# Patient Record
Sex: Female | Born: 1955 | Race: White | Hispanic: No | Smoking: Current every day smoker
Health system: Southern US, Community
[De-identification: ages and names within clinical notes are randomized; demographics above are authoritative.]

## PROBLEM LIST (undated history)

## (undated) DIAGNOSIS — Z86010 Personal history of colonic polyps: Secondary | ICD-10-CM

## (undated) DIAGNOSIS — F329 Major depressive disorder, single episode, unspecified: Secondary | ICD-10-CM

## (undated) DIAGNOSIS — K589 Irritable bowel syndrome without diarrhea: Secondary | ICD-10-CM

## (undated) DIAGNOSIS — F32A Depression, unspecified: Secondary | ICD-10-CM

## (undated) DIAGNOSIS — I10 Essential (primary) hypertension: Secondary | ICD-10-CM

## (undated) DIAGNOSIS — Z800 Family history of malignant neoplasm of digestive organs: Secondary | ICD-10-CM

## (undated) DIAGNOSIS — K625 Hemorrhage of anus and rectum: Secondary | ICD-10-CM

## (undated) DIAGNOSIS — K5730 Diverticulosis of large intestine without perforation or abscess without bleeding: Secondary | ICD-10-CM

## (undated) DIAGNOSIS — K648 Other hemorrhoids: Secondary | ICD-10-CM

## (undated) DIAGNOSIS — I7300 Raynaud's syndrome without gangrene: Secondary | ICD-10-CM

## (undated) DIAGNOSIS — K5909 Other constipation: Secondary | ICD-10-CM

## (undated) HISTORY — DX: Major depressive disorder, single episode, unspecified: F32.9

## (undated) HISTORY — PX: TUBAL LIGATION: SHX77

## (undated) HISTORY — DX: Irritable bowel syndrome, unspecified: K58.9

## (undated) HISTORY — DX: Family history of malignant neoplasm of digestive organs: Z80.0

## (undated) HISTORY — DX: Other hemorrhoids: K64.8

## (undated) HISTORY — DX: Other constipation: K59.09

## (undated) HISTORY — DX: Diverticulosis of large intestine without perforation or abscess without bleeding: K57.30

## (undated) HISTORY — DX: Depression, unspecified: F32.A

## (undated) HISTORY — DX: Personal history of colonic polyps: Z86.010

## (undated) HISTORY — PX: POLYPECTOMY: SHX149

## (undated) HISTORY — DX: Hemorrhage of anus and rectum: K62.5

## (undated) HISTORY — DX: Raynaud's syndrome without gangrene: I73.00

## (undated) HISTORY — PX: COLONOSCOPY: SHX174

## (undated) HISTORY — PX: FEMUR FRACTURE SURGERY: SHX633

---
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
## 1998-06-05 ENCOUNTER — Ambulatory Visit (HOSPITAL_COMMUNITY): Admission: RE | Admit: 1998-06-05 | Discharge: 1998-06-05 | Payer: Self-pay | Admitting: Family Medicine

## 1998-06-06 ENCOUNTER — Ambulatory Visit (HOSPITAL_COMMUNITY): Admission: RE | Admit: 1998-06-06 | Discharge: 1998-06-06 | Payer: Self-pay | Admitting: Family Medicine

## 2001-03-27 ENCOUNTER — Ambulatory Visit (HOSPITAL_COMMUNITY): Admission: RE | Admit: 2001-03-27 | Discharge: 2001-03-27 | Payer: Self-pay | Admitting: Obstetrics & Gynecology

## 2001-09-02 ENCOUNTER — Encounter: Payer: Self-pay | Admitting: Obstetrics & Gynecology

## 2001-09-02 ENCOUNTER — Ambulatory Visit (HOSPITAL_COMMUNITY): Admission: RE | Admit: 2001-09-02 | Discharge: 2001-09-02 | Payer: Self-pay | Admitting: Obstetrics & Gynecology

## 2002-05-17 ENCOUNTER — Other Ambulatory Visit: Admission: RE | Admit: 2002-05-17 | Discharge: 2002-05-17 | Payer: Self-pay | Admitting: Obstetrics and Gynecology

## 2002-09-06 ENCOUNTER — Encounter: Payer: Self-pay | Admitting: Obstetrics & Gynecology

## 2002-09-06 ENCOUNTER — Ambulatory Visit (HOSPITAL_COMMUNITY): Admission: RE | Admit: 2002-09-06 | Discharge: 2002-09-06 | Payer: Self-pay | Admitting: Obstetrics & Gynecology

## 2003-06-27 ENCOUNTER — Other Ambulatory Visit: Admission: RE | Admit: 2003-06-27 | Discharge: 2003-06-27 | Payer: Self-pay | Admitting: Obstetrics & Gynecology

## 2004-12-10 ENCOUNTER — Ambulatory Visit (HOSPITAL_COMMUNITY): Admission: RE | Admit: 2004-12-10 | Discharge: 2004-12-10 | Payer: Self-pay | Admitting: Obstetrics & Gynecology

## 2005-01-22 ENCOUNTER — Other Ambulatory Visit: Admission: RE | Admit: 2005-01-22 | Discharge: 2005-01-22 | Payer: Self-pay | Admitting: Obstetrics & Gynecology

## 2006-01-27 ENCOUNTER — Ambulatory Visit (HOSPITAL_COMMUNITY): Admission: RE | Admit: 2006-01-27 | Discharge: 2006-01-27 | Payer: Self-pay | Admitting: Specialist

## 2007-03-24 ENCOUNTER — Ambulatory Visit: Payer: Self-pay | Admitting: Gastroenterology

## 2007-11-12 HISTORY — PX: ABDOMINAL HYSTERECTOMY: SHX81

## 2007-12-22 ENCOUNTER — Ambulatory Visit: Payer: Self-pay | Admitting: Gastroenterology

## 2007-12-28 ENCOUNTER — Encounter: Payer: Self-pay | Admitting: Gastroenterology

## 2007-12-28 ENCOUNTER — Ambulatory Visit: Payer: Self-pay | Admitting: Gastroenterology

## 2007-12-29 DIAGNOSIS — Z860100 Personal history of colon polyps, unspecified: Secondary | ICD-10-CM

## 2007-12-29 HISTORY — DX: Personal history of colon polyps, unspecified: Z86.0100

## 2007-12-29 HISTORY — DX: Personal history of colonic polyps: Z86.010

## 2008-03-23 ENCOUNTER — Ambulatory Visit: Payer: Self-pay | Admitting: Gastroenterology

## 2008-03-23 LAB — CONVERTED CEMR LAB
ALT: 20 units/L (ref 0–35)
AST: 17 units/L (ref 0–37)
Albumin: 4.1 g/dL (ref 3.5–5.2)
Alkaline Phosphatase: 46 units/L (ref 39–117)
Anti Nuclear Antibody(ANA): NEGATIVE
BUN: 10 mg/dL (ref 6–23)
Basophils Absolute: 0.1 10*3/uL (ref 0.0–0.1)
Basophils Relative: 1.2 % — ABNORMAL HIGH (ref 0.0–1.0)
Bilirubin, Direct: 0.2 mg/dL (ref 0.0–0.3)
CO2: 27 meq/L (ref 19–32)
Calcium: 9.2 mg/dL (ref 8.4–10.5)
Chloride: 107 meq/L (ref 96–112)
Creatinine, Ser: 0.8 mg/dL (ref 0.4–1.2)
Eosinophils Absolute: 0.2 10*3/uL (ref 0.0–0.7)
Eosinophils Relative: 1.7 % (ref 0.0–5.0)
Ferritin: 77.3 ng/mL (ref 10.0–291.0)
Folate: 8.9 ng/mL
GFR calc Af Amer: 98 mL/min
GFR calc non Af Amer: 81 mL/min
Glucose, Bld: 104 mg/dL — ABNORMAL HIGH (ref 70–99)
HCT: 40.6 % (ref 36.0–46.0)
Hemoglobin: 13.8 g/dL (ref 12.0–15.0)
Iron: 72 ug/dL (ref 42–145)
Lymphocytes Relative: 28.9 % (ref 12.0–46.0)
MCHC: 33.9 g/dL (ref 30.0–36.0)
MCV: 93.2 fL (ref 78.0–100.0)
Monocytes Absolute: 0.6 10*3/uL (ref 0.1–1.0)
Monocytes Relative: 6.6 % (ref 3.0–12.0)
Neutro Abs: 5.7 10*3/uL (ref 1.4–7.7)
Neutrophils Relative %: 61.6 % (ref 43.0–77.0)
Platelets: 219 10*3/uL (ref 150–400)
Potassium: 4.6 meq/L (ref 3.5–5.1)
RBC: 4.35 M/uL (ref 3.87–5.11)
RDW: 12.4 % (ref 11.5–14.6)
Saturation Ratios: 18.2 % — ABNORMAL LOW (ref 20.0–50.0)
Sed Rate: 9 mm/hr (ref 0–22)
Sodium: 141 meq/L (ref 135–145)
Total Bilirubin: 1.2 mg/dL (ref 0.3–1.2)
Total Protein: 6.8 g/dL (ref 6.0–8.3)
Transferrin: 282 mg/dL (ref >=212.0)
Vitamin B-12: 461 pg/mL (ref 211–911)
WBC: 9.3 10*3/uL (ref 4.5–10.5)

## 2008-10-10 ENCOUNTER — Ambulatory Visit (HOSPITAL_COMMUNITY): Admission: RE | Admit: 2008-10-10 | Discharge: 2008-10-11 | Payer: Self-pay | Admitting: Obstetrics & Gynecology

## 2008-10-10 ENCOUNTER — Encounter (INDEPENDENT_AMBULATORY_CARE_PROVIDER_SITE_OTHER): Payer: Self-pay | Admitting: Obstetrics & Gynecology

## 2009-01-10 ENCOUNTER — Telehealth: Payer: Self-pay | Admitting: Gastroenterology

## 2009-01-11 DIAGNOSIS — K625 Hemorrhage of anus and rectum: Secondary | ICD-10-CM | POA: Insufficient documentation

## 2009-01-11 DIAGNOSIS — K5909 Other constipation: Secondary | ICD-10-CM | POA: Insufficient documentation

## 2009-01-11 DIAGNOSIS — Z860100 Personal history of colon polyps, unspecified: Secondary | ICD-10-CM | POA: Insufficient documentation

## 2009-01-11 DIAGNOSIS — K589 Irritable bowel syndrome without diarrhea: Secondary | ICD-10-CM | POA: Insufficient documentation

## 2009-01-11 DIAGNOSIS — I7300 Raynaud's syndrome without gangrene: Secondary | ICD-10-CM | POA: Insufficient documentation

## 2009-01-11 DIAGNOSIS — K648 Other hemorrhoids: Secondary | ICD-10-CM | POA: Insufficient documentation

## 2009-01-11 DIAGNOSIS — K219 Gastro-esophageal reflux disease without esophagitis: Secondary | ICD-10-CM | POA: Insufficient documentation

## 2009-01-12 ENCOUNTER — Ambulatory Visit: Payer: Self-pay | Admitting: Gastroenterology

## 2010-04-10 ENCOUNTER — Ambulatory Visit (HOSPITAL_COMMUNITY): Admission: RE | Admit: 2010-04-10 | Discharge: 2010-04-10 | Payer: Self-pay | Admitting: Obstetrics & Gynecology

## 2010-12-01 ENCOUNTER — Encounter: Payer: Self-pay | Admitting: Obstetrics & Gynecology

## 2011-03-26 NOTE — Assessment & Plan Note (Signed)
[facility]                         GASTROENTEROLOGY OFFICE NOTE   VINA, BYRD                    MRN:          [id number]  DATE:12/22/2007                            DOB:          [birth year]    INDICATION:  Breanna Phillips is a 55 year old white female who has recurrent  rectal bleeding with some associated rectal discomfort.  She was last  seen in May of 08.  At that time, she gave a strong family history of  colon cancer in her mother.  She did not follow through with her  colonoscopy as recommended.  She continues with occasional rectal  bleeding and mild rectal discomfort especially over the last several  days.  She denies other GI complaints except for chronic GERD, and she  is on over-the-counter Prilosec.  She has had no associated dysphagia or  hepatobiliary complaints.   In retrospect, she does have a positive family history of what sounds  like Raynaud's phenomenon.  She also continued to smoke cigarettes and  uses ethanol socially.  She denies associated skin rashes,  photosensitivity or any symptoms of vasculitis but does have arthralgias  in her elbows.  She has never been diagnosed as having cardiovascular  disease, and she follows a regular diet, her appetite is good, and she  denies any systemic complaints such as fever or chills.   Her only medications are multivitamins, calcium, and estrogen patch.  She denies any other associated medical problems.  She has had a  previous tubal ligation.  As mentioned above, her mother had colon  cancer in her 90s.   PHYSICAL EXAMINATION:  GENERAL:  She is an attractive, healthy-appearing  white female in no distress.  She appears her stated age.  VITAL SIGNS:  She weighs 128 pounds, and blood pressure is 124/68, pulse  was 84 and regular.  SKIN:  She did have very cold bluish digits without any definite  evidence of periungual telangiectasia.  I could not appreciate any joint  swelling,  edema, or other skin rashes.  She had no stigmata of chronic  liver disease.  CHEST:  Clear.  She was in a regular rhythm without murmurs, gallops or  rubs.  There was no organomegaly, abdominal masses, tenderness, and  bowel sounds were normal.  EXTREMITIES:  Otherwise were unremarkable.  MENTAL STATUS:  Clear.  RECTAL:  Inspection of the rectum showed no fissures or fistulae,  hemorrhoids or other lesions.  RECTAL EXAM:  Showed no masses or tenderness with soft stool in the  rectal vault that was a normal color and guaiac negative.   ASSESSMENT:  1. Intermittent rectal bleeding in a 55 year old female with probable      internal hemorrhoids but a strong family history of colon carcinoma      - rule out rectosigmoid polyps and/or carcinoma.  2. 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   RECOMMENDATIONS:  1. Check CBC and anemia profile.  Also would check ANA.  2. Reflux resume with daily Aciphex therapy.  3. Outpatient colonoscopy exam.  4. Anusol  HC suppositories at bedtime until a workup could be      completed.     Vania Rea. Jarold Motto, MD, Caleen Essex, FAGA  Electronically Signed    DRP/MedQ  DD: 12/22/2007  DT: 12/23/2007  Job #: [id number]

## 2011-03-26 NOTE — Assessment & Plan Note (Signed)
[facility]                         GASTROENTEROLOGY OFFICE NOTE   Breanna, Phillips                    MRN:          [id number]  DATE:03/24/2007                            DOB:          01/12/1956    Breanna Phillips is a 55 year old white female referred for evaluation of  rectal pain, which occurred approximately a month ago, and responded to  cortisone suppositories.  She has had some intermittent rectal bleeding  for years that she has attributed external hemorrhoids, and has been  told in the past that she had irritable bowel syndrome.  She currently  describes some mild constipation.  Denies abdominal pain, gas, bloating,  or any upper GI or hepatobiliary complaints.  It is of note, that her  mother developed colon carcinoma in her early 28s.   The patient follows a regular diet, and denies any specific food  intolerances.  She has had no anorexia or weight loss.   PAST MEDICAL HISTORY:  Otherwise noncontributory except for previous  tubal ligation.   FAMILY HISTORY:  Remarkable for colon cancer in her mother.  Her mother  and sister also have hypertension.   SOCIAL HISTORY:  She is divorced, and has 2 children.  She currently is  self employed, and does have a graduate degree.  She smokes a quarter  pack of cigarettes per day, and uses ethanol socially.   REVIEW OF SYSTEMS:  Noncontributory.  Last menstrual period earlier this  month.   MEDICATIONS:  1. Multivitamins daily.  2. Calcium 1500 mg a day.  3. Omega 3 vitamins daily.  4. Vitamin D daily.   SHE DENIES DRUG ALLERGIES.   EXAMINATION:  She is an attractive, healthy-appearing, white female, in  no acute distress, appearing her stated age.  She is 5 feet 2 inches, and weighs 127 pounds.  Blood pressure 130/82.  Pulse was 70 and regular.  I could not appreciate stigmata of chronic liver disease.  CHEST:  Was clear.  She appeared to be in a regular rhythm without murmurs,  gallops, or  rubs.  There is no hepatosplenomegaly, abdominal masses, or tenderness.  Bowel  sounds were normal.  Peripheral extremities were unremarkable.  Mental status was clear.  Inspection of the rectum showed no fissures or fistulae.  Rectal exam  showed no masses or tenderness with formed stool in the rectal vault  that was guaiac negative.   ASSESSMENT:  1. Probable resolved thrombosed internal hemorrhoid.  2. Constipation with predominant irritable bowel syndrome.  3. Family history of colon carcinoma.   RECOMMENDATIONS:  1. High-fiber diet with fiber supplements as needed.  2. Outpatient colonoscopy at her convenience.  3. Continue other medications per her primary care physicians.     Breanna Phillips. Jarold Motto, MD, Caleen Essex, FAGA  Electronically Signed    DRP/MedQ  DD: 03/24/2007  DT: 03/24/2007  Job #: [id number]   cc:   Prompt [facility]

## 2011-03-26 NOTE — H&P (Signed)
Breanna Phillips, Breanna Phillips             ACCOUNT NO.:  [id number]   MEDICAL RECORD NO.:  [id number]          PATIENT TYPE:  AMB   LOCATION:  SDC                           FACILITY:  [facility]   PHYSICIAN:  Freddy Finner, M.D.   DATE OF BIRTH:  Apr 01, 1956   DATE OF ADMISSION:  10/10/2008  DATE OF DISCHARGE:                              HISTORY & PHYSICAL   ADMISSION DIAGNOSIS:  1. Uterine leiomyomata.  2. Benign previous pelvic endometriosis.  3. Previous surgical sterilization.  4. Clinical symptoms of severe dysmenorrhea.  5. Pelvic pain.  6. Menometrorrhagia.   HISTORY OF PRESENT ILLNESS:  The patient is a 55 year old white divorced  female, gravida 4, para 2, who has a long history of perimenopausal and  abnormal bleeding history dating back at least until 2003.  Intermittently she was treated with hormonal replacement therapy and  episodically at some points in the past with oral contraceptives.  Over  the last 10-11 months she has not used any form of hormonal therapy and  has continued to have regular cycles, some at 16-day intervals with very  heavy bleeding and severe dysmenorrhea.  She is known by previous  surgical procedure of laparoscopy in the distant past which demonstrated  pelvic endometriosis and created some difficulty with conception.  She  did adequately conceive and carry two children to approximately 37  weeks' gestation.  The first was delivered vaginally, the second by  cesarean for double footling breech presentation.  In 2000 she had tubal  ligation with Filshie clip application.  Because of her current  constellation of GYN symptoms she has requested definitive surgical  intervention.  She is now admitted for laparoscopically assisted vaginal  hysterectomy, bilateral salpingo-oophorectomy.   PAST MEDICAL HISTORY:  In addition to above, includes personal history  of irritable bowel syndrome.   ALLERGIES:  She does not record any drug allergies.   PAST SURGICAL  HISTORY:  Include those noted in the present illness.  She  also had a tonsillectomy at an early age.   SOCIAL HISTORY:  She does use cigarettes intermittently.  She does use  alcohol socially.  She has never required a blood transfusion.   MEDICATIONS:  She is currently on no medications at the present time.   FAMILY HISTORY:  Remarkable for ovarian cancer in her mother who  apparently survived this and was diagnosed with the original diagnosis  in her late 62s.  Apparently she had colon cancer at the age of 58.   PHYSICAL EXAMINATION:  HEENT:  Grossly within normal limits.  NECK:  Thyroid gland is not palpably enlarged.  VITAL SIGNS:  Blood pressure in the office 140/80, height 5 feet 2  inches, weight 130.  CHEST:  Clear to auscultation.  HEART:  Normal sinus rhythm without murmurs, rubs, or gallops.  ABDOMEN:  Soft and nontender without appreciable organomegaly or  palpable masses.  BREASTS:  Exam considered to be normal.  She has had bilateral breast  augmentation.  No palpable masses, no skin change, no nipple retraction  or discharge is noted.  No axillary adenopathy.  ABDOMEN:  Soft  and nontender without appreciable organomegaly or  palpable masses.  PELVIC:  External genitalia, vagina, and cervix are normal to  inspection.  Bimanual reveals uterus to be upper normal in size.  There  is fullness noted in the right adnexa (on ultrasound this was confirmed  and revealed follicular type or hemorrhagic type cyst.  The left adnexa  is clear.  RECTAL:  The rectum is palpably normal.  Rectovaginal exam confirms the  above findings.  EXTREMITIES:  Without cyanosis, clubbing, or edema.   ASSESSMENT:  1. Uterine leiomyomata.  2. Simple-appearing right ovarian cyst.  3. Cystic areas within the myometrium consistent with adenomyosis.  4. Known history of pelvic endometriosis with previous treatment.  5. Family history of ovarian cancer.   PLAN:  Laparoscopically-assisted vaginal  hysterectomy, bilateral  salpingo-oophorectomy.  The potential risks of the procedure have been  reviewed with her including a brochure on the procedure by the ACOG.  This includes risk of injury to other organs, particularly to bladder  and bowel and vessels, risk of infection, risk of intraoperative or  postoperative hemorrhage.  Prophylactic measures have been discussed  with her including IV antibiotic and serial compression devices on lower  extremities during surgery.  The patient is now admitted and prepared to  proceed with surgery.      Freddy Finner, M.D.  Electronically Signed     WRN/MEDQ  D:  10/09/2008  T:  10/09/2008  Job:  [id number]

## 2011-03-26 NOTE — Assessment & Plan Note (Signed)
[facility]                                 ON-CALL NOTE   Breanna, Phillips                      MRN:          [id number]  DATE:12/19/2007                            DOB:          01-17-1956    Breanna Phillips is a 55 year old woman followed by Dr. Sheryn Bison.  I  am not sure what her diagnosis is.  I think it is irritable bowel  syndrome.  She has had a little bit of a pressure-like sensation in her  abdomen this week.  She has had some small amounts of bright red blood  on the toilet paper earlier in the week.  Today she had a normal brown  bowel movement was associated with some bright red blood into the  commode.  She feels okay at this time.  There is been no further  bleeding.  I recommended that we can try to get her seen in the office  and she can call for follow-up on Monday.  If this recurs,  she is  either to call me back or go to the emergency room or Urgent Care for  further evaluation, particularly if she develops severe pain or large  amounts of passing blood per rectum.     Iva Boop, MD,FACG  Electronically Signed    CEG/MedQ  DD: 12/19/2007  DT: 12/21/2007  Job #: [id number]   cc:   Vania Rea. Jarold Motto, MD, Caleen Essex, FAGA

## 2011-03-26 NOTE — Op Note (Signed)
Breanna Phillips, Breanna Phillips             ACCOUNT NO.:  [id number]   MEDICAL RECORD NO.:  [id number]          PATIENT TYPE:  OIB   LOCATION:  9304                          FACILITY:  [facility]   PHYSICIAN:  Freddy Finner, M.D.   DATE OF BIRTH:  1956/06/12   DATE OF PROCEDURE:  10/10/2008  DATE OF DISCHARGE:                               OPERATIVE REPORT   PRIORITY OPERATIVE REPORT   PREOPERATIVE DIAGNOSES:  1. Uterine leiomyomata.  2. Suspected uterine adenomyosis.  3. Known previous pelvic endometriosis.  4. Clinical symptoms of severe menorrhagia, dysmenorrhea, and pelvic      pain.   POSTOPERATIVE DIAGNOSES:  1. Uterine leiomyomata.  2. Suspected uterine adenomyosis.  3. Known previous pelvic endometriosis.  4. Clinical symptoms of severe menorrhagia, dysmenorrhea, and pelvic      pain.  5. No more evidence of recurrent endometriosis and pelvic adhesions.   OPERATIVE PROCEDURES:  1. Laparoscopically-assisted vaginal hysterectomy.  2. Lysis of pelvic adhesions.  3. Bilateral salpingo-oophorectomy.  4. Fulguration of endometriotic lesions of right pelvic sidewall.   SURGEON:  Freddy Finner, MD.   ASSISTANT:  Dineen Kid. Rana Snare, MD.   ANESTHESIA:  General endotracheal.   ESTIMATED INTRAOPERATIVE BLOOD LOSS:  300 mL.   INTRAOPERATIVE COMPLICATIONS:  None.   Details of the present illness recorded in the admission note.  The  patient was admitted on the morning of surgery.  She was given a gram of  Ancef.  She was placed in serial compression devices on her lower  extremities.  She was brought to the operating room and placed under  adequate general endotracheal anesthesia, placed in the dorsal lithotomy  position using the [place] stirrups system.  Betadine prep of abdomen,  perineum, and vagina was carried out with Betadine scrub followed by  Betadine solution.  The bladder was evacuated with a sterile technique  and a Hulka tenaculum was attached to the cervix under direct  visualization.  Sterile drapes were applied.  Two small incisions were  made, 1 through an old scar at the umbilicus and 1 just above the  symphysis through an old lower abdominal transverse scar.  Through the  upper incision, an 11 mm bladed disposable trocar was introduced while  elevating the anterior abdominal wall manually.  Direct inspection  revealed adequate placement within the peritoneal cavity with no  evidence of injury on entry.  Pneumoperitoneum was allowed to accumulate  with carbon dioxide gas.  A second trocar was placed with direct  visualization through the small incision above the symphysis.  A 5-mm  blade and trocar was used at this location and through it spring-loading  grasping forceps and later the Nezhat irrigation probe was used.  Systematic examination of pelvic and abdominal contents was carried out  as noted and findings were as noted in the postoperative diagnosis.  There was 1 small filmy adhesion in the midportion of the appendix,  which was otherwise completely normal, and it was left in place.  The  liver appeared to be normal.  There was no apparent abnormality in the  upper abdomen.  Using the Enseal  device and while placing traction on  the uterus with the Hulka and the adnexa on the right with the grasping  forceps, progressive pedicles were developed, sealed, and divided down  to a level just above the uterine artery.  The adhesions on the left  side were broken up with traction without further sharp dissection.  This adnexa was treated similarly with the dissection carried down to  the level just above the uterine arteries.  Two small endometriotic  lesions on the right pelvic sidewall were fulgurated with the Enseal  device.  Attention was turned vaginally.  A posterior weighted vaginal  retractor was placed, the Hulka tenaculum was removed, Deaver retractors  were used anteriorly and laterally for exposure.  The cervix was grasped  with a Christella Hartigan  tenaculum.  Colpotomy incision was made while tenting the  mucosa posterior to the cervix.  The cervix was circumscribed with a  scalpel to release the mucosa.  A LigaSure was then used to seal and  divide the uterosacral pedicles on each side and the bladder pillars on  each side.  The bladder was carefully advanced off the cervix.  The  cardinal ligament pedicles were sealed and divided using a LigaSure.  The anterior peritoneum was entered.  Vessel pedicles were taken with a  LigaSure, sealed, and divided.  An additional pedicle on each side above  the vessels was taken, which completely released the uterus.  The uterus  and tubes and ovaries were delivered through the vaginal introitus.  Moist tapes were used to pack the intestinal contents out of the pelvis.  The angles of the vaginal were anchored with mattress sutures of 0  Monocryl to the uterosacral on each side.  The uterosacrals were  plicated and posterior peritoneum closed with an interrupted 0 Monocryl  suture.  The cuff was closed vertically with a figure-of-eight 0  Monocryl.  A Foley catheter was placed.  Reinspection was then carried  out laparoscopically and using the Nezhat system with lactated Ringers  and vacuum aspiration, the pelvic pedicles were carefully washed and  evaluated.  A number of small oozing sites were controlled with the  Enseal.  After complete hemostasis was achieved, it was confirmed by  inspection under reduced intraabdominal pressure and under irrigation.  At this point, the procedure was complete.  The irrigating solution was  aspirated from the abdomen, the instruments removed.  The skin incisions  were closed with interrupted subcuticular sutures of 3-0 Dexon, Steri-  Strips were applied to the lower incision, a small compression bandage  was applied to the umbilicus.  The patient was awakened and taken to  recovery in good condition.      Freddy Finner, M.D.  Electronically  Signed     WRN/MEDQ  D:  10/10/2008  T:  10/10/2008  Job:  [id number]

## 2011-03-29 NOTE — Discharge Summary (Signed)
Breanna Phillips, BAUMBACH             ACCOUNT NO.:  [id number]   MEDICAL RECORD NO.:  [id number]          PATIENT TYPE:  OIB   LOCATION:  9304                          FACILITY:  [facility]   PHYSICIAN:  Freddy Finner, M.D.   DATE OF BIRTH:  1956/04/25   DATE OF ADMISSION:  10/10/2008  DATE OF DISCHARGE:  10/11/2008                               DISCHARGE SUMMARY   DISCHARGE DIAGNOSES:  1. Uterine leiomyomata.  2. Uterine adenomyosis.   Clinical symptoms of severe dysmenorrhea and pelvic pain.  She did have  pelvic adhesions, but no residual endometriosis.   OPERATIVE PROCEDURE:  Laparoscopic-assisted vaginal hysterectomy, lysis  of pelvic adhesions, bilateral salpingo-oophorectomy and progression of  1 lesion, which was perhaps suspicious for endometriosis on the right  pelvic sidewall.  Intraoperative and postoperative complications were  none.   LABORATORY DATA DURING THIS ADMISSION:  Preoperative CBC that was  normal.  Postoperative hemoglobin was 10.9, prothrombin time and PTT on  admission were normal.   History of present illness, past history, family history, review of  systems and physical exam are recorded in the admission note.  Her  physical exam was primarily remarkable for her pelvic findings with  uterine enlargement and ultrasound findings consistent with leiomyomata  and adenomyosis.   HOSPITAL COURSE:  Following admission, the patient was given 1 g of  Ancef preoperatively.  She was placed on PAS hose for her lower  extremity.  She was taken to the operating room and the above described  procedure was accomplished.  There were no intraoperative complications.  Postoperatively, the patient made excellent progress and on the first  postoperative day, she was ambulating without difficulty having adequate  bowel and bladder function, tolerating a regular diet.  She was  discharged home and progressively increasing physical activity, but no  heavy lifting or vaginal  entry.  She is to report fever or bleeding.  She was given Percocet to be taken for postoperative pain.  She is to  return to the office in approximately 2 weeks for postop followup.      Freddy Finner, M.D.  Electronically Signed     WRN/MEDQ  D:  12/17/2008  T:  12/18/2008  Job:  [id number]

## 2011-03-29 NOTE — Op Note (Signed)
[facility]  Patient:    Breanna Phillips, Breanna Phillips                      MRN: [id number] Proc. Date: 03/27/01 Attending:  Freddy Finner, M.D.                           Operative Report  PREOPERATIVE DIAGNOSIS:       Multiparity, request for surgical sterilization.  POSTOPERATIVE DIAGNOSIS:      Multiparity, request for surgical sterilization, with boggy enlargement of the uterus to approximately eight weeks gestational size.  OPERATION:                    Laparoscopy with application of Filshie clips to the isthmic portion of each fallopian tube.  INTRAOPERATIVE COMPLICATIONS: None.  INTRAOPERATIVE BLOOD LOSS:    5 cc.  ANESTHESIA:                   General endotracheal.  PROCEDURE:                    The patient is a 55 year old who has requested definitive surgical sterilization and is admitted at this time for that purpose.  She was admitted on the morning of surgery.  She was brought to the operating room, placed under adequate general endotracheal anesthesia, placed in the dorsolithotomy position using the [place] stirrup system.  Betadine prep of the abdomen, perineum, and vagina was carried out.  Hulka tenaculum was attached to the cervix under direct visualization.  The bladder was evacuated with the [facility] catheter, sterile drapes were applied.  A small incision was made at the umbilicus through an old scar and, while elevating the anterior abdominal wall, an 11 mm disposable trocar was introduced.  Inspection with the laparoscope revealed adequate placement with no evidence of injury on entry.  Pneumoperitoneum was allowed to accumulate with carbon dioxide gas. Scanning inspection of the upper abdomen revealed no apparent abnormalities. The appendix could not be visualized.  The pelvic structures were normal except for mild enlargement of the uterus.  The Filshie clip applying device was evacuated through the operating channel of the  laparoscopy and a Filshie clip applied to the isthmic portion of each fallopian tube without difficulty and without bleeding.  On completing the procedure, gas was allowed to escape from the abdomen.  The instruments were removed.  The incision at the umbilicus was closed with interrupted subcuticular sutures of 3-0 Dexon and 1/4-inch Steri-Strips.  Local block with 0.25% Marcaine was injected at the incision site for postoperative analgesia.  The patient was awakened and taken to the recovery room in good condition.  She will be discharged with routine outpatient surgical instructions for follow-up in the office in two weeks. DD:  03/27/01 TD:  03/27/01 Job: [id number]

## 2011-04-02 ENCOUNTER — Other Ambulatory Visit (HOSPITAL_COMMUNITY): Payer: Self-pay | Admitting: Obstetrics & Gynecology

## 2011-04-02 DIAGNOSIS — Z1231 Encounter for screening mammogram for malignant neoplasm of breast: Secondary | ICD-10-CM

## 2011-04-23 ENCOUNTER — Ambulatory Visit (HOSPITAL_COMMUNITY)
Admission: RE | Admit: 2011-04-23 | Discharge: 2011-04-23 | Disposition: A | Discharge status: Home / Self Care | Payer: BC Managed Care – PPO | Source: Ambulatory Visit | Attending: Obstetrics & Gynecology | Admitting: Obstetrics & Gynecology

## 2011-04-23 DIAGNOSIS — Z1231 Encounter for screening mammogram for malignant neoplasm of breast: Secondary | ICD-10-CM | POA: Insufficient documentation

## 2011-05-09 ENCOUNTER — Ambulatory Visit: Payer: Self-pay | Admitting: Gastroenterology

## 2011-08-13 LAB — CBC
MCHC: 34.3 g/dL (ref 30.0–36.0)
MCV: 94.2 fL (ref 78.0–100.0)
Platelets: 203 10*3/uL (ref 150–400)
RBC: 4.46 MIL/uL (ref 3.87–5.11)
RDW: 12.5 % (ref 11.5–15.5)

## 2011-08-13 LAB — PROTIME-INR: Prothrombin Time: 12.8 seconds (ref 11.6–15.2)

## 2011-08-13 LAB — APTT: aPTT: 26 seconds (ref 24–37)

## 2011-08-15 LAB — CBC
HCT: 31.5 % — ABNORMAL LOW (ref 36.0–46.0)
Platelets: 164 10*3/uL (ref 150–400)
WBC: 15.4 10*3/uL — ABNORMAL HIGH (ref 4.0–10.5)

## 2011-10-25 ENCOUNTER — Telehealth: Payer: Self-pay | Admitting: Gastroenterology

## 2011-10-25 NOTE — Telephone Encounter (Signed)
Pt with hx of family hx of Colon Cancer, Chronic Constipation, IBS, GERD, Rectal Bleeding, Internal Hemorrhoids, Hyperplastic Colon Polyps. Last OV 01/12/2009 for rectal bleeding; last COLON 12/28/2007. Pt reports she has seen her OB/GYN for pain under her right rib cage that has gotten worse. GYN thinks she may have gall bladder problems.  Pt given an appt with Dr Jarold Motto on 10/31/11 at 10:30am.

## 2011-10-31 ENCOUNTER — Encounter: Payer: Self-pay | Admitting: Gastroenterology

## 2011-10-31 ENCOUNTER — Ambulatory Visit: Payer: BC Managed Care – PPO | Admitting: Gastroenterology

## 2011-10-31 ENCOUNTER — Other Ambulatory Visit: Payer: Self-pay | Admitting: Gastroenterology

## 2011-10-31 ENCOUNTER — Other Ambulatory Visit (INDEPENDENT_AMBULATORY_CARE_PROVIDER_SITE_OTHER): Payer: BC Managed Care – PPO

## 2011-10-31 DIAGNOSIS — K5730 Diverticulosis of large intestine without perforation or abscess without bleeding: Secondary | ICD-10-CM

## 2011-10-31 DIAGNOSIS — R109 Unspecified abdominal pain: Secondary | ICD-10-CM | POA: Insufficient documentation

## 2011-10-31 DIAGNOSIS — K219 Gastro-esophageal reflux disease without esophagitis: Secondary | ICD-10-CM

## 2011-10-31 DIAGNOSIS — E739 Lactose intolerance, unspecified: Secondary | ICD-10-CM | POA: Insufficient documentation

## 2011-10-31 DIAGNOSIS — Z86010 Personal history of colonic polyps: Secondary | ICD-10-CM

## 2011-10-31 DIAGNOSIS — Z800 Family history of malignant neoplasm of digestive organs: Secondary | ICD-10-CM | POA: Insufficient documentation

## 2011-10-31 DIAGNOSIS — I7300 Raynaud's syndrome without gangrene: Secondary | ICD-10-CM

## 2011-10-31 LAB — CBC WITH DIFFERENTIAL/PLATELET
Basophils Absolute: 0 10*3/uL (ref 0.0–0.1)
Basophils Relative: 0.4 % (ref 0.0–3.0)
Eosinophils Relative: 1.5 % (ref 0.0–5.0)
HCT: 39.2 % (ref 36.0–46.0)
Hemoglobin: 13.3 g/dL (ref 12.0–15.0)
Lymphocytes Relative: 31.3 % (ref 12.0–46.0)
Lymphs Abs: 2.8 10*3/uL (ref 0.7–4.0)
Monocytes Relative: 5.3 % (ref 3.0–12.0)
Neutro Abs: 5.5 10*3/uL (ref 1.4–7.7)
RBC: 4.18 Mil/uL (ref 3.87–5.11)
RDW: 13.4 % (ref 11.5–14.6)
WBC: 9 10*3/uL (ref 4.5–10.5)

## 2011-10-31 LAB — BASIC METABOLIC PANEL
BUN: 13 mg/dL (ref 6–23)
CO2: 28 mEq/L (ref 19–32)
Calcium: 9.5 mg/dL (ref 8.4–10.5)
Chloride: 106 mEq/L (ref 96–112)
Creatinine, Ser: 0.7 mg/dL (ref 0.4–1.2)
GFR: 87.12 mL/min (ref >60.00)
Glucose, Bld: 92 mg/dL (ref 70–99)
Sodium: 142 mEq/L (ref 135–145)

## 2011-10-31 LAB — SEDIMENTATION RATE: Sed Rate: 15 mm/hr (ref 0–22)

## 2011-10-31 LAB — HEPATIC FUNCTION PANEL
Albumin: 4.6 g/dL (ref 3.5–5.2)
Alkaline Phosphatase: 60 U/L (ref 39–117)
Total Protein: 7.7 g/dL (ref 6.0–8.3)

## 2011-10-31 LAB — TSH: TSH: 2.22 u[IU]/mL (ref 0.35–5.50)

## 2011-10-31 LAB — IBC PANEL
Iron: 81 ug/dL (ref 42–145)
Saturation Ratios: 20.4 % (ref 20.0–50.0)

## 2011-10-31 LAB — VITAMIN B12: Vitamin B-12: 555 pg/mL (ref 211–911)

## 2011-10-31 MED ORDER — DEXLANSOPRAZOLE 60 MG PO CPDR: 60.0000 mg | DELAYED_RELEASE_CAPSULE | Freq: Every day | ORAL | Status: AC

## 2011-10-31 MED ORDER — SUCRALFATE 1 GM/10ML PO SUSP: 1.0000 g | Freq: Four times a day (QID) | ORAL | Status: DC

## 2011-10-31 NOTE — Patient Instructions (Signed)
Stop your Prilosec and start Dexilant take one a day.  Take carafate as needed.  Please go to the basement today for your labs.  Your abdominal ultrasound is scheduled for 11/06/2011 arrive at 8:45am for a 9am appt at [facility]. Have nothing to eat or drink after midnight.  Call back to schedule your colonoscopy and endoscopy and pre visit in a few weeks when Feb 2013 schedule is out.   Diet for GERD or PUD Nutrition therapy can help ease the discomfort of gastroesophageal reflux disease (GERD) and peptic ulcer disease (PUD).  HOME CARE INSTRUCTIONS   Eat your meals slowly, in a relaxed setting.   Eat 5 to 6 small meals per day.   If a food causes distress, stop eating it for a period of time.  FOODS TO AVOID  Coffee, regular or decaffeinated.   Cola beverages, regular or low calorie.   Tea, regular or decaffeinated.   Pepper.   Cocoa.   High fat foods, including meats.   Butter, margarine, hydrogenated oil (trans fats).   Peppermint or spearmint (if you have GERD).   Fruits and vegetables if not tolerated.   Alcohol.   Nicotine (smoking or chewing). This is one of the most potent stimulants to acid production in the gastrointestinal tract.   Any food that seems to aggravate your condition.  If you have questions regarding your diet, ask your caregiver or a registered dietitian. TIPS  Lying flat may make symptoms worse. Keep the head of your bed raised 6 to 9 inches (15 to 23 cm) by using a foam wedge or blocks under the legs of the bed.   Do not lay down until 3 hours after eating a meal.   Daily physical activity may help reduce symptoms.  MAKE SURE YOU:   Understand these instructions.   Will watch your condition.   Will get help right away if you are not doing well or get worse.  Document Released: 10/28/2005 Document Revised: 07/10/2011 Document Reviewed: 03/13/2009 [facility] Patient Information 2012 [place], [place].

## 2011-10-31 NOTE — Progress Notes (Signed)
History of Present Illness:  This is a very pleasant 55 year old Caucasian female with a family history of colon cancer, and previous colonoscopy 5 years ago with removal of adenomatous polyps. She also has Raynaud's phenomenon with an associated esophageal motility disturbance and chronic GERD. She is on Prilosec 20 mg a day with only mild control of her acid reflux and burning substernal pain. She has regular bowel movements without melena or hematochezia. Her main problem today is several months of right upper quadrant pain radiating into her back without root precipitating or alleviating elements. She's had nausea but no emesis, but denies clay-colored stools, dark urine, icterus, or any known history of gallbladder disease, pancreatitis or hepatitis. Both her mother and her older sister have had cholecystectomies. She's had no fever, chills, or other systemic complaints. Her appetite is good and her weight is stable. She does appear to have some lactose intolerance with associated gas and bloating.  I have reviewed this patient's present history, medical and surgical past history, allergies and medications.     ROS: The remainder of the 10 point ROS is negative     Physical Exam: General well developed well nourished patient in no acute distress, appearing her stated age Eyes PERRLA, no icterus, fundoscopic exam per opthamologist Skin no lesions noted Neck supple, no adenopathy, no thyroid enlargement, no tenderness Chest clear to percussion and auscultation Heart no significant murmurs, gallops or rubs noted Abdomen no hepatosplenomegaly masses or tenderness, BS normal. . Extremities no acute joint lesions, edema, phlebitis or evidence of cellulitis. Neurologic patient oriented x 3, cranial nerves intact, no focal neurologic deficits noted. Psychological mental status normal and normal affect.  Assessment and plan: Rule out cholelithiasis versus gas trapping in the right upper quadrant  area associated with her IBS. We will proceed with upper abdominal ultrasound exam, check pertinent labs, and I have scheduled followup endoscopy and colonoscopy per family history. Patient has not had previous endoscopic exam, and we will rule out Barrett's mucosa and the possibility of H. pylori infection. I changed her to Exelon 60 mg 30 minutes before breakfast because of her GERD and Raynaud's phenomenon. I have asked her to use Lactaid tablets with any major dairy products. He is to continue to avoid sorbitol and fructose in her diet. She has noticed worsening of her GERD with institution of [place] anti-spasmodic agent for her bladder. The patient does take when necessary Advil, aspirin, and chronic Macrodantin.  Encounter Diagnoses  Name Primary?  . Diverticulosis of colon (without mention of hemorrhage)   . Abdominal pain   . GERD (gastroesophageal reflux disease)   . Family history of malignant neoplasm of gastrointestinal tract   . Personal history of colonic polyps   . Raynaud phenomenon

## 2011-11-01 LAB — TISSUE TRANSGLUTAMINASE, IGA: Tissue Transglutaminase Ab, IgA: 3.5 U/mL (ref <20)

## 2011-11-06 ENCOUNTER — Ambulatory Visit (HOSPITAL_COMMUNITY)
Admission: RE | Admit: 2011-11-06 | Discharge: 2011-11-06 | Disposition: A | Discharge status: Home / Self Care | Payer: BC Managed Care – PPO | Source: Ambulatory Visit | Attending: Gastroenterology | Admitting: Gastroenterology

## 2011-11-06 DIAGNOSIS — R109 Unspecified abdominal pain: Secondary | ICD-10-CM | POA: Insufficient documentation

## 2011-11-06 DIAGNOSIS — K5730 Diverticulosis of large intestine without perforation or abscess without bleeding: Secondary | ICD-10-CM

## 2011-11-18 ENCOUNTER — Telehealth: Payer: Self-pay | Admitting: Gastroenterology

## 2011-11-18 NOTE — Telephone Encounter (Signed)
lmom for pt to call back

## 2011-11-19 NOTE — Telephone Encounter (Signed)
Informed pt of her lab results and U/S results. Mailed pt a copy of these tests per her request.

## 2011-12-16 ENCOUNTER — Telehealth: Payer: Self-pay | Admitting: *Deleted

## 2011-12-16 ENCOUNTER — Ambulatory Visit (AMBULATORY_SURGERY_CENTER): Payer: BC Managed Care – PPO | Admitting: *Deleted

## 2011-12-16 ENCOUNTER — Encounter: Payer: Self-pay | Admitting: Gastroenterology

## 2011-12-16 DIAGNOSIS — K5710 Diverticulosis of small intestine without perforation or abscess without bleeding: Secondary | ICD-10-CM

## 2011-12-16 DIAGNOSIS — R109 Unspecified abdominal pain: Secondary | ICD-10-CM

## 2011-12-16 DIAGNOSIS — Z800 Family history of malignant neoplasm of digestive organs: Secondary | ICD-10-CM

## 2011-12-16 DIAGNOSIS — Z86010 Personal history of colonic polyps: Secondary | ICD-10-CM

## 2011-12-16 MED ORDER — PEG-KCL-NACL-NASULF-NA ASC-C 100 G PO SOLR: ORAL | Status: DC

## 2011-12-16 NOTE — Progress Notes (Signed)
No allergy to eggs or soy products 

## 2011-12-16 NOTE — Telephone Encounter (Signed)
Breanna Phillips, This patient was here for her PV this morning and had some questions for you about her medication.  You weren't in your office so I told her that I would send you a note.  She asks that you call her back and if you try before 1:00 please call her work number [phone].    Thank you, Baxter Hire

## 2011-12-16 NOTE — Telephone Encounter (Signed)
Left message with associate for pt to call back.

## 2011-12-17 ENCOUNTER — Telehealth: Payer: Self-pay | Admitting: Gastroenterology

## 2011-12-17 MED ORDER — DEXLANSOPRAZOLE 60 MG PO CPDR: 60.0000 mg | DELAYED_RELEASE_CAPSULE | Freq: Every day | ORAL | Status: DC

## 2011-12-17 NOTE — Telephone Encounter (Signed)
See other encounter.

## 2011-12-17 NOTE — Telephone Encounter (Signed)
Pt called to ask about Dexilant. She reports it's too expensive and she can't afford it. She also reports she's tried to dole it out by taking it QOD and she seems to be doing fine. Informed her I will leave her sample at the front desk of whatever I can find. Left samples of Dexilant with a discount card; pt stated understanding.

## 2011-12-19 ENCOUNTER — Telehealth: Payer: Self-pay | Admitting: Gastroenterology

## 2011-12-19 NOTE — Telephone Encounter (Signed)
Pt spoke with Dwan Bolt today concerning the letter she received today. Morrie Sheldon wrote to inform her she may be paying for her ECL because she has a $5000 deductible plan and each procedure can cost up to $1400 or more apiece.  Pt was under the assumption this procedure is preventative and was informed d/t her diagnoses and hx of polyps, it will not be coded that way. Pt did not like or agree with GERD and Lactose intolerance. I read to her from a book about the association between Raynaud's and esophageal motility disturbance and Chronic GERD; pt did not know of that relationship. She also disagreed with Lactose Intolerance. You dictated " she does appear to have some lactose intolerance with associated gas and bloating". In her Assessment and plan, you wrote for her to use Lactaid tablets with any major dairy products. Pt denies any of this about the lactose intolerance and would like it removed. Pt insisted I inform you of this hoping you will remove it from her list of dx. Please advise. ECL with propofol for 12/30/11. Thanks.

## 2011-12-20 NOTE — Telephone Encounter (Signed)
Cancel her appt and d/c letter

## 2011-12-20 NOTE — Telephone Encounter (Signed)
lmom of cell # for pt to call back.

## 2011-12-23 ENCOUNTER — Telehealth: Payer: Self-pay | Admitting: Gastroenterology

## 2011-12-23 NOTE — Telephone Encounter (Signed)
Pt reports she has spoken to our Billing Ofc and they are sending her paperwork. When she completes the paperwork she will decide whether or not to have her ECL on 12/30/11. Informed her Dr Jarold Motto will not change any of the DX on her visit. She has been communicating back and forth with her insurance and because the procedures aren't "preventative" they will not pay. Verified with her she must call by 12/25/11 to avoid a cancellation fee; pt stated understanding.

## 2011-12-25 ENCOUNTER — Telehealth: Payer: Self-pay | Admitting: *Deleted

## 2011-12-25 ENCOUNTER — Encounter: Payer: Self-pay | Admitting: *Deleted

## 2011-12-25 MED ORDER — PANTOPRAZOLE SODIUM 40 MG PO TBEC: 40.0000 mg | DELAYED_RELEASE_TABLET | Freq: Every day | ORAL | Status: DC

## 2011-12-25 NOTE — Telephone Encounter (Signed)
pts insurance will not cover dexilant since she has not tried any other ppi except prilosec. I have called in pantoprazole for 30 days she will try it and see if it works for her.

## 2011-12-26 ENCOUNTER — Telehealth: Payer: Self-pay | Admitting: Gastroenterology

## 2011-12-26 NOTE — Telephone Encounter (Signed)
error 

## 2011-12-26 NOTE — Telephone Encounter (Signed)
Dismissal Letter sent by Certified Mail 12/26/2011  Received Return Receipt stating the patient has received the Dismissal Letter 01/01/2012

## 2011-12-27 ENCOUNTER — Telehealth: Payer: Self-pay | Admitting: Gastroenterology

## 2011-12-27 NOTE — Telephone Encounter (Signed)
12/26/11. Called patient and informed her that Dr. Jarold Motto was discharging her from the practice and therefore we were canceling her procedure for Monday.  She said "fine" and demanded a copy of her records.  I told her that I would have to transfer her to medical records and they could help her.  I accidentally hung up on her during the transfer and when she called back I apologized to her.  She was very upset (said a few choice words) and then said I want you to tell Dr. Jarold Motto that  "if he ran his practice based solely on what that little insurance girl said, then he must not be much of a doctor and I don't want to be a patient of his".   I politely thanked her and told her that I would make note of that and then transferred her to medical records.

## 2011-12-30 ENCOUNTER — Encounter: Payer: BC Managed Care – PPO | Admitting: Gastroenterology

## 2012-06-11 ENCOUNTER — Other Ambulatory Visit (HOSPITAL_COMMUNITY): Payer: Self-pay | Admitting: Obstetrics & Gynecology

## 2012-06-11 DIAGNOSIS — Z1231 Encounter for screening mammogram for malignant neoplasm of breast: Secondary | ICD-10-CM

## 2012-07-03 ENCOUNTER — Ambulatory Visit (HOSPITAL_COMMUNITY)
Admission: RE | Admit: 2012-07-03 | Discharge: 2012-07-03 | Disposition: A | Discharge status: Home / Self Care | Payer: BC Managed Care – PPO | Source: Ambulatory Visit | Attending: Obstetrics & Gynecology | Admitting: Obstetrics & Gynecology

## 2012-07-03 DIAGNOSIS — Z1231 Encounter for screening mammogram for malignant neoplasm of breast: Secondary | ICD-10-CM | POA: Insufficient documentation

## 2013-08-24 ENCOUNTER — Ambulatory Visit (INDEPENDENT_AMBULATORY_CARE_PROVIDER_SITE_OTHER): Payer: BC Managed Care – PPO | Admitting: Psychology

## 2013-08-24 ENCOUNTER — Encounter (HOSPITAL_COMMUNITY): Payer: Self-pay | Admitting: Psychology

## 2013-08-24 ENCOUNTER — Encounter (INDEPENDENT_AMBULATORY_CARE_PROVIDER_SITE_OTHER): Payer: Self-pay

## 2013-08-24 DIAGNOSIS — F411 Generalized anxiety disorder: Secondary | ICD-10-CM

## 2013-08-24 DIAGNOSIS — F331 Major depressive disorder, recurrent, moderate: Secondary | ICD-10-CM
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
## 2013-12-01 ENCOUNTER — Other Ambulatory Visit (HOSPITAL_COMMUNITY): Payer: Self-pay | Admitting: Obstetrics and Gynecology

## 2013-12-01 DIAGNOSIS — Z1231 Encounter for screening mammogram for malignant neoplasm of breast: Secondary | ICD-10-CM

## 2013-12-07 ENCOUNTER — Ambulatory Visit (HOSPITAL_COMMUNITY)
Admission: RE | Admit: 2013-12-07 | Discharge: 2013-12-07 | Disposition: A | Discharge status: Home / Self Care | Payer: BC Managed Care – PPO | Source: Ambulatory Visit | Attending: Obstetrics and Gynecology | Admitting: Obstetrics and Gynecology

## 2013-12-07 DIAGNOSIS — Z1231 Encounter for screening mammogram for malignant neoplasm of breast: Secondary | ICD-10-CM

## 2013-12-31 ENCOUNTER — Encounter (HOSPITAL_COMMUNITY): Payer: Self-pay | Admitting: Psychology

## 2013-12-31 DIAGNOSIS — F331 Major depressive disorder, recurrent, moderate: Secondary | ICD-10-CM

## 2013-12-31 DIAGNOSIS — F411 Generalized anxiety disorder: Secondary | ICD-10-CM

## 2013-12-31 NOTE — Progress Notes (Signed)
Patient ID: Breanna SenateNancy C Luten, female   DOB: 04-15-1956, 58 y.o.   MRN: [id number] Outpatient Therapist Discharge Summary  Breanna Senateancy C Phillips    04-15-1956   Admission Date: 08/24/13   Discharge Date:  12/31/13 Reason for Discharge:  Didn't return for services after admission Diagnosis:    Generalized anxiety disorder  Major depressive disorder, recurrent episode, moderate  Comments:  May return if needed in future  Forde RadonLeanne Yates

## 2014-03-01 ENCOUNTER — Encounter (HOSPITAL_COMMUNITY): Payer: Self-pay | Admitting: Emergency Medicine

## 2014-03-01 ENCOUNTER — Emergency Department (HOSPITAL_COMMUNITY): Payer: BC Managed Care – PPO | Admitting: Certified Registered Nurse Anesthetist

## 2014-03-01 ENCOUNTER — Encounter (HOSPITAL_COMMUNITY)
Admission: EM | Disposition: A | Discharge status: Home / Self Care | Payer: Self-pay | Source: Home / Self Care | Attending: Emergency Medicine

## 2014-03-01 ENCOUNTER — Encounter (HOSPITAL_COMMUNITY): Payer: BC Managed Care – PPO | Admitting: Certified Registered Nurse Anesthetist

## 2014-03-01 ENCOUNTER — Emergency Department (HOSPITAL_COMMUNITY): Payer: BC Managed Care – PPO

## 2014-03-01 ENCOUNTER — Observation Stay (HOSPITAL_COMMUNITY)
Admission: EM | Admit: 2014-03-01 | Discharge: 2014-03-01 | Disposition: A | Discharge status: Home / Self Care | Payer: BC Managed Care – PPO | Attending: Surgery | Admitting: Surgery

## 2014-03-01 DIAGNOSIS — K648 Other hemorrhoids: Secondary | ICD-10-CM | POA: Insufficient documentation

## 2014-03-01 DIAGNOSIS — K37 Unspecified appendicitis: Secondary | ICD-10-CM | POA: Diagnosis present

## 2014-03-01 DIAGNOSIS — K219 Gastro-esophageal reflux disease without esophagitis: Secondary | ICD-10-CM | POA: Insufficient documentation

## 2014-03-01 DIAGNOSIS — R1031 Right lower quadrant pain: Secondary | ICD-10-CM

## 2014-03-01 DIAGNOSIS — Z87891 Personal history of nicotine dependence: Secondary | ICD-10-CM | POA: Insufficient documentation

## 2014-03-01 DIAGNOSIS — F329 Major depressive disorder, single episode, unspecified: Secondary | ICD-10-CM | POA: Insufficient documentation

## 2014-03-01 DIAGNOSIS — I7300 Raynaud's syndrome without gangrene: Secondary | ICD-10-CM | POA: Insufficient documentation

## 2014-03-01 DIAGNOSIS — K5730 Diverticulosis of large intestine without perforation or abscess without bleeding: Secondary | ICD-10-CM | POA: Insufficient documentation

## 2014-03-01 DIAGNOSIS — I739 Peripheral vascular disease, unspecified: Secondary | ICD-10-CM | POA: Insufficient documentation

## 2014-03-01 DIAGNOSIS — Z860100 Personal history of colon polyps, unspecified: Secondary | ICD-10-CM | POA: Insufficient documentation

## 2014-03-01 DIAGNOSIS — K3580 Unspecified acute appendicitis: Secondary | ICD-10-CM

## 2014-03-01 DIAGNOSIS — F3289 Other specified depressive episodes: Secondary | ICD-10-CM | POA: Insufficient documentation

## 2014-03-01 DIAGNOSIS — K589 Irritable bowel syndrome without diarrhea: Secondary | ICD-10-CM | POA: Insufficient documentation

## 2014-03-01 HISTORY — PX: APPENDECTOMY: SHX54

## 2014-03-01 HISTORY — PX: LAPAROSCOPIC APPENDECTOMY: SHX408

## 2014-03-01 HISTORY — DX: Essential (primary) hypertension: I10

## 2014-03-01 LAB — CBC WITH DIFFERENTIAL/PLATELET
BASOS ABS: 0 10*3/uL (ref 0.0–0.1)
BASOS PCT: 0 % (ref 0–1)
EOS ABS: 0.2 10*3/uL (ref 0.0–0.7)
Eosinophils Relative: 1 % (ref 0–5)
HCT: 42.5 % (ref 36.0–46.0)
Hemoglobin: 14.2 g/dL (ref 12.0–15.0)
Lymphocytes Relative: 12 % (ref 12–46)
Lymphs Abs: 2.7 10*3/uL (ref 0.7–4.0)
MCH: 31.8 pg (ref 26.0–34.0)
MCHC: 33.4 g/dL (ref 30.0–36.0)
MCV: 95.1 fL (ref 78.0–100.0)
Monocytes Absolute: 0.9 10*3/uL (ref 0.1–1.0)
Monocytes Relative: 4 % (ref 3–12)
NEUTROS ABS: 18.9 10*3/uL — AB (ref 1.7–7.7)
NEUTROS PCT: 83 % — AB (ref 43–77)
Platelets: 216 10*3/uL (ref 150–400)
RBC: 4.47 MIL/uL (ref 3.87–5.11)
RDW: 13.6 % (ref 11.5–15.5)
WBC: 22.7 10*3/uL — ABNORMAL HIGH (ref 4.0–10.5)

## 2014-03-01 LAB — BASIC METABOLIC PANEL
BUN: 19 mg/dL (ref 6–23)
CHLORIDE: 101 meq/L (ref 96–112)
CO2: 26 mEq/L (ref 19–32)
CREATININE: 0.99 mg/dL (ref 0.50–1.10)
Calcium: 9.5 mg/dL (ref 8.4–10.5)
GFR calc non Af Amer: 63 mL/min — ABNORMAL LOW (ref >90)
GFR, EST AFRICAN AMERICAN: 73 mL/min — AB (ref >90)
Glucose, Bld: 103 mg/dL — ABNORMAL HIGH (ref 70–99)
POTASSIUM: 4.6 meq/L (ref 3.7–5.3)
Sodium: 140 mEq/L (ref 137–147)

## 2014-03-01 LAB — URINALYSIS, ROUTINE W REFLEX MICROSCOPIC
Bilirubin Urine: NEGATIVE
Glucose, UA: NEGATIVE mg/dL
Hgb urine dipstick: NEGATIVE
KETONES UR: NEGATIVE mg/dL
Leukocytes, UA: NEGATIVE
NITRITE: NEGATIVE
Protein, ur: NEGATIVE mg/dL
SPECIFIC GRAVITY, URINE: 1.017 (ref 1.005–1.030)
UROBILINOGEN UA: 0.2 mg/dL (ref 0.0–1.0)
pH: 6.5 (ref 5.0–8.0)

## 2014-03-01 SURGERY — APPENDECTOMY, LAPAROSCOPIC
Anesthesia: General | Site: Abdomen

## 2014-03-01 MED ORDER — DIPHENHYDRAMINE HCL 12.5 MG/5ML PO ELIX: 12.5000 mg | ORAL_SOLUTION | Freq: Four times a day (QID) | ORAL | Status: DC | PRN

## 2014-03-01 MED ORDER — PIPERACILLIN-TAZOBACTAM 3.375 G IVPB
3.3750 g | Freq: Once | INTRAVENOUS | Status: AC
  Administered 2014-03-01: 3.375 g via INTRAVENOUS
  Filled 2014-03-01: qty 50

## 2014-03-01 MED ORDER — ENOXAPARIN SODIUM 40 MG/0.4ML ~~LOC~~ SOLN: 40.0000 mg | SUBCUTANEOUS | Status: DC

## 2014-03-01 MED ORDER — DEXAMETHASONE SODIUM PHOSPHATE 4 MG/ML IJ SOLN
INTRAMUSCULAR | Status: DC | PRN
  Administered 2014-03-01: 4 mg via INTRAVENOUS

## 2014-03-01 MED ORDER — POTASSIUM CHLORIDE IN NACL 20-0.9 MEQ/L-% IV SOLN
INTRAVENOUS | Status: DC
  Filled 2014-03-01 (×2): qty 1000

## 2014-03-01 MED ORDER — ONDANSETRON HCL 4 MG PO TABS: 4.0000 mg | ORAL_TABLET | Freq: Four times a day (QID) | ORAL | Status: DC | PRN

## 2014-03-01 MED ORDER — KETOROLAC TROMETHAMINE 30 MG/ML IJ SOLN
INTRAMUSCULAR | Status: DC | PRN
  Administered 2014-03-01: 30 mg via INTRAVENOUS

## 2014-03-01 MED ORDER — ONDANSETRON HCL 4 MG/2ML IJ SOLN: 4.0000 mg | Freq: Once | INTRAMUSCULAR | Status: DC | PRN

## 2014-03-01 MED ORDER — ONDANSETRON HCL 4 MG/2ML IJ SOLN: 4.0000 mg | Freq: Four times a day (QID) | INTRAMUSCULAR | Status: DC | PRN

## 2014-03-01 MED ORDER — OXYCODONE HCL 5 MG PO TABS
5.0000 mg | ORAL_TABLET | ORAL | Status: DC | PRN
  Administered 2014-03-01: 10 mg via ORAL
  Filled 2014-03-01: qty 2

## 2014-03-01 MED ORDER — ROCURONIUM BROMIDE 100 MG/10ML IV SOLN
INTRAVENOUS | Status: DC | PRN
  Administered 2014-03-01: 25 mg via INTRAVENOUS

## 2014-03-01 MED ORDER — SODIUM CHLORIDE 0.9 % IR SOLN
Status: DC | PRN
  Administered 2014-03-01: 1000 mL

## 2014-03-01 MED ORDER — IOHEXOL 300 MG/ML  SOLN
80.0000 mL | Freq: Once | INTRAMUSCULAR | Status: AC | PRN
  Administered 2014-03-01: 80 mL via INTRAVENOUS

## 2014-03-01 MED ORDER — ONDANSETRON HCL 4 MG/2ML IJ SOLN
4.0000 mg | Freq: Once | INTRAMUSCULAR | Status: AC
  Administered 2014-03-01: 4 mg via INTRAVENOUS
  Filled 2014-03-01: qty 2

## 2014-03-01 MED ORDER — ACETAMINOPHEN 650 MG RE SUPP: 650.0000 mg | Freq: Four times a day (QID) | RECTAL | Status: DC | PRN

## 2014-03-01 MED ORDER — LACTATED RINGERS IV SOLN
INTRAVENOUS | Status: DC
Start: 2014-03-01 — End: 2014-03-01
  Administered 2014-03-01: 09:00:00 via INTRAVENOUS

## 2014-03-01 MED ORDER — LIDOCAINE HCL (CARDIAC) 20 MG/ML IV SOLN
INTRAVENOUS | Status: DC | PRN
  Administered 2014-03-01: 60 mg via INTRAVENOUS

## 2014-03-01 MED ORDER — FENTANYL CITRATE 0.05 MG/ML IJ SOLN
INTRAMUSCULAR | Status: DC | PRN
  Administered 2014-03-01: 150 ug via INTRAVENOUS
  Administered 2014-03-01: 25 ug via INTRAVENOUS

## 2014-03-01 MED ORDER — IOHEXOL 300 MG/ML  SOLN
20.0000 mL | INTRAMUSCULAR | Status: DC
  Administered 2014-03-01: 20 mL via ORAL

## 2014-03-01 MED ORDER — MORPHINE SULFATE 2 MG/ML IJ SOLN: 1.0000 mg | INTRAMUSCULAR | Status: DC | PRN

## 2014-03-01 MED ORDER — NEOSTIGMINE METHYLSULFATE 1 MG/ML IJ SOLN
INTRAMUSCULAR | Status: DC | PRN
  Administered 2014-03-01: 3 mg via INTRAVENOUS

## 2014-03-01 MED ORDER — OXYCODONE-ACETAMINOPHEN 5-325 MG PO TABS: 1.0000 | ORAL_TABLET | ORAL | Status: DC | PRN

## 2014-03-01 MED ORDER — DIPHENHYDRAMINE HCL 50 MG/ML IJ SOLN: 12.5000 mg | Freq: Four times a day (QID) | INTRAMUSCULAR | Status: DC | PRN

## 2014-03-01 MED ORDER — IBUPROFEN 600 MG PO TABS: 600.0000 mg | ORAL_TABLET | Freq: Four times a day (QID) | ORAL | Status: DC | PRN

## 2014-03-01 MED ORDER — ONDANSETRON HCL 4 MG/2ML IJ SOLN
INTRAMUSCULAR | Status: AC
  Filled 2014-03-01: qty 2

## 2014-03-01 MED ORDER — PROPOFOL 10 MG/ML IV BOLUS
INTRAVENOUS | Status: AC
  Filled 2014-03-01: qty 20

## 2014-03-01 MED ORDER — ROCURONIUM BROMIDE 50 MG/5ML IV SOLN
INTRAVENOUS | Status: AC
  Filled 2014-03-01: qty 1

## 2014-03-01 MED ORDER — MORPHINE SULFATE 4 MG/ML IJ SOLN: 4.0000 mg | INTRAMUSCULAR | Status: DC | PRN

## 2014-03-01 MED ORDER — HYDROMORPHONE HCL PF 1 MG/ML IJ SOLN: 0.2500 mg | INTRAMUSCULAR | Status: DC | PRN

## 2014-03-01 MED ORDER — CEFAZOLIN SODIUM-DEXTROSE 2-3 GM-% IV SOLR
INTRAVENOUS | Status: DC | PRN
  Administered 2014-03-01: 2 g via INTRAVENOUS

## 2014-03-01 MED ORDER — ONDANSETRON HCL 4 MG/2ML IJ SOLN
INTRAMUSCULAR | Status: DC | PRN
  Administered 2014-03-01: 4 mg via INTRAVENOUS

## 2014-03-01 MED ORDER — PROPOFOL 10 MG/ML IV BOLUS
INTRAVENOUS | Status: DC | PRN
  Administered 2014-03-01: 200 mg via INTRAVENOUS

## 2014-03-01 MED ORDER — GLYCOPYRROLATE 0.2 MG/ML IJ SOLN
INTRAMUSCULAR | Status: DC | PRN
  Administered 2014-03-01: 0.4 mg via INTRAVENOUS

## 2014-03-01 MED ORDER — HYDROMORPHONE HCL PF 1 MG/ML IJ SOLN
1.0000 mg | INTRAMUSCULAR | Status: DC | PRN
Start: 2014-03-01 — End: 2014-03-01

## 2014-03-01 MED ORDER — ACETAMINOPHEN 500 MG PO TABS: 1000.0000 mg | ORAL_TABLET | Freq: Four times a day (QID) | ORAL | Status: DC | PRN

## 2014-03-01 MED ORDER — BUPIVACAINE-EPINEPHRINE 0.5% -1:200000 IJ SOLN
INTRAMUSCULAR | Status: DC | PRN
  Administered 2014-03-01: 30 mL

## 2014-03-01 MED ORDER — LACTATED RINGERS IV SOLN
INTRAVENOUS | Status: DC | PRN
  Administered 2014-03-01: 13:00:00 via INTRAVENOUS

## 2014-03-01 MED ORDER — PANTOPRAZOLE SODIUM 40 MG PO TBEC: 40.0000 mg | DELAYED_RELEASE_TABLET | Freq: Every day | ORAL | Status: DC

## 2014-03-01 MED ORDER — TRAMADOL HCL 50 MG PO TABS: 50.0000 mg | ORAL_TABLET | Freq: Four times a day (QID) | ORAL | Status: AC | PRN

## 2014-03-01 MED ORDER — LIDOCAINE HCL (CARDIAC) 20 MG/ML IV SOLN
INTRAVENOUS | Status: AC
  Filled 2014-03-01: qty 5

## 2014-03-01 MED ORDER — KETOROLAC TROMETHAMINE 30 MG/ML IJ SOLN
30.0000 mg | Freq: Once | INTRAMUSCULAR | Status: AC
  Administered 2014-03-01: 30 mg via INTRAVENOUS
  Filled 2014-03-01: qty 1

## 2014-03-01 MED ORDER — FENTANYL CITRATE 0.05 MG/ML IJ SOLN
INTRAMUSCULAR | Status: AC
  Filled 2014-03-01: qty 5

## 2014-03-01 MED ORDER — BUPIVACAINE-EPINEPHRINE (PF) 0.5% -1:200000 IJ SOLN
INTRAMUSCULAR | Status: AC
  Filled 2014-03-01: qty 10

## 2014-03-01 SURGICAL SUPPLY — 48 items
APL SKNCLS STERI-STRIP NONHPOA (GAUZE/BANDAGES/DRESSINGS) ×1
APPLIER CLIP LOGIC TI 5 (MISCELLANEOUS) ×1 IMPLANT
APPLIER CLIP ROT 10 11.4 M/L (STAPLE)
APR CLP MED LRG 11.4X10 (STAPLE)
APR CLP MED LRG 33X5 (MISCELLANEOUS) ×1
BAG SPEC RTRVL LRG 6X4 10 (ENDOMECHANICALS) ×1
BANDAGE ADH SHEER 1  50/CT (GAUZE/BANDAGES/DRESSINGS) ×3 IMPLANT
BANDAGE ADHESIVE 1X3 (GAUZE/BANDAGES/DRESSINGS) ×6 IMPLANT
BENZOIN TINCTURE PRP APPL 2/3 (GAUZE/BANDAGES/DRESSINGS) ×2 IMPLANT
CANISTER SUCTION 2500CC (MISCELLANEOUS) ×2 IMPLANT
CHLORAPREP W/TINT 26ML (MISCELLANEOUS) ×2 IMPLANT
CLIP APPLIE ROT 10 11.4 M/L (STAPLE) IMPLANT
COVER SURGICAL LIGHT HANDLE (MISCELLANEOUS) ×2 IMPLANT
CUTTER LINEAR ENDO 35 ETS (STAPLE) ×1 IMPLANT
CUTTER LINEAR ENDO 35 ETS TH (STAPLE) IMPLANT
DECANTER SPIKE VIAL GLASS SM (MISCELLANEOUS) ×1 IMPLANT
DRAPE UTILITY 15X26 W/TAPE STR (DRAPE) ×4 IMPLANT
ELECT REM PT RETURN 9FT ADLT (ELECTROSURGICAL) ×2
ELECTRODE REM PT RTRN 9FT ADLT (ELECTROSURGICAL) ×1 IMPLANT
ENDOLOOP SUT PDS II  0 18 (SUTURE)
ENDOLOOP SUT PDS II 0 18 (SUTURE) IMPLANT
GLOVE BIOGEL PI IND STRL 7.0 (GLOVE) IMPLANT
GLOVE BIOGEL PI INDICATOR 7.0 (GLOVE) ×3
GLOVE SURG SIGNA 7.5 PF LTX (GLOVE) ×2 IMPLANT
GLOVE SURG SS PI 7.0 STRL IVOR (GLOVE) ×1 IMPLANT
GOWN STRL REUS W/ TWL LRG LVL3 (GOWN DISPOSABLE) ×1 IMPLANT
GOWN STRL REUS W/ TWL XL LVL3 (GOWN DISPOSABLE) ×1 IMPLANT
GOWN STRL REUS W/TWL LRG LVL3 (GOWN DISPOSABLE) ×6
GOWN STRL REUS W/TWL XL LVL3 (GOWN DISPOSABLE) ×2
KIT BASIN OR (CUSTOM PROCEDURE TRAY) ×2 IMPLANT
KIT ROOM TURNOVER OR (KITS) ×2 IMPLANT
NS IRRIG 1000ML POUR BTL (IV SOLUTION) ×2 IMPLANT
PAD ARMBOARD 7.5X6 YLW CONV (MISCELLANEOUS) ×4 IMPLANT
POUCH SPECIMEN RETRIEVAL 10MM (ENDOMECHANICALS) ×2 IMPLANT
RELOAD /EVU35 (ENDOMECHANICALS) IMPLANT
RELOAD CUTTER ETS 35MM STAND (ENDOMECHANICALS) IMPLANT
SCALPEL HARMONIC ACE (MISCELLANEOUS) ×2 IMPLANT
SET IRRIG TUBING LAPAROSCOPIC (IRRIGATION / IRRIGATOR) ×2 IMPLANT
SLEEVE ENDOPATH XCEL 5M (ENDOMECHANICALS) ×2 IMPLANT
SPECIMEN JAR SMALL (MISCELLANEOUS) ×2 IMPLANT
STRIP CLOSURE SKIN 1/2X4 (GAUZE/BANDAGES/DRESSINGS) ×1 IMPLANT
SUT MON AB 4-0 PC3 18 (SUTURE) ×2 IMPLANT
TOWEL OR 17X24 6PK STRL BLUE (TOWEL DISPOSABLE) ×2 IMPLANT
TOWEL OR 17X26 10 PK STRL BLUE (TOWEL DISPOSABLE) ×2 IMPLANT
TRAY LAPAROSCOPIC (CUSTOM PROCEDURE TRAY) ×2 IMPLANT
TROCAR XCEL BLUNT TIP 100MML (ENDOMECHANICALS) ×2 IMPLANT
TROCAR XCEL NON-BLD 5MMX100MML (ENDOMECHANICALS) ×2 IMPLANT
WATER STERILE IRR 1000ML POUR (IV SOLUTION) IMPLANT

## 2014-03-01 NOTE — Transfer of Care (Signed)
Immediate Anesthesia Transfer of Care Note  Patient: Breanna SenateNancy C Phillips  Procedure(s) Performed: Procedure(s): APPENDECTOMY LAPAROSCOPIC (N/A)  Patient Location: PACU  Anesthesia Type:General  Level of Consciousness: awake, alert  and oriented  Airway & Oxygen Therapy: Patient Spontanous Breathing and Patient connected to nasal cannula oxygen  Post-op Assessment: Report given to PACU RN, Post -op Vital signs reviewed and stable and Patient moving all extremities  Post vital signs: Reviewed and stable  Complications: No apparent anesthesia complications

## 2014-03-01 NOTE — Discharge Summary (Signed)
Physician Discharge Summary  Patient ID: Breanna Phillips MRN: [id number] DOB/AGE: 1956/09/20 58 y.o.  Admit date: 03/01/2014 Discharge date: 03/01/2014  Admission Diagnoses:  Discharge Diagnoses:  Principal Problem:   Acute appendicitis Active Problems:   Appendicitis   Discharged Condition: good  Hospital Course: admitted and taken to OR.  Did well post op and discharged the day of surgery  Consults: None  Significant Diagnostic Studies:   Treatments: surgery: laparoscopic appendectomy  Discharge Exam: Blood pressure 141/65, pulse 57, temperature 97.5 F (36.4 C), temperature source Oral, resp. rate 16, height 5\' 2"  (1.575 m), weight 130 lb (58.968 kg), SpO2 100.00%. General appearance: alert, cooperative and no distress Resp: clear to auscultation bilaterally Cardio: regular rate and rhythm, S1, S2 normal, no murmur, click, rub or gallop Incision/Wound: abdomen soft, incisions dry  Disposition:      Medication List         calcium citrate-vitamin D 315-200 MG-UNIT per tablet  Commonly known as:  CITRACAL+D  Take 1 tablet by mouth daily.     cholecalciferol 1000 UNITS tablet  Commonly known as:  VITAMIN D  Take 2,000 Units by mouth daily.     estradiol 0.05 MG/24HR patch  Commonly known as:  VIVELLE-DOT  Place 1 patch onto the skin 2 (two) times a week.     Fish Oil 1000 MG Caps  Take 1 capsule by mouth daily.     loratadine 10 MG tablet  Commonly known as:  CLARITIN  Take 10 mg by mouth daily.     MAGNESIUM-OXIDE 400 (241.3 MG) MG tablet  Generic drug:  magnesium oxide  Take 400 mg by mouth daily.     traMADol 50 MG tablet  Commonly known as:  ULTRAM  Take 1-2 tablets (50-100 mg total) by mouth every 6 (six) hours as needed.     zolpidem 10 MG tablet  Commonly known as:  AMBIEN  Take 5 mg by mouth at bedtime as needed for sleep.           Follow-up Information   [facility] Sterling A, [facility]. Call in 2 weeks. (call office tomorrow  to set up follow up appt)    Specialty:  General Surgery   Contact information:   [address] [place] [id number] [phone]       Signed: Shelly RubensteinDouglas A Tiffinie Caillier 03/01/2014, 4:25 PM

## 2014-03-01 NOTE — Anesthesia Preprocedure Evaluation (Addendum)
Anesthesia Evaluation  Patient identified by MRN, date of birth, ID band Patient awake    Reviewed: Allergy & Precautions, H&P , NPO status , Patient's Chart, lab work & pertinent test results  Airway Mallampati: I TM Distance: >3 FB Neck ROM: Full    Dental  (+) Teeth Intact, Dental Advisory Given   Pulmonary Current Smoker,          Cardiovascular + Peripheral Vascular Disease     Neuro/Psych    GI/Hepatic GERD-  ,  Endo/Other    Renal/GU      Musculoskeletal   Abdominal   Peds  Hematology   Anesthesia Other Findings   Reproductive/Obstetrics                          Anesthesia Physical Anesthesia Plan  ASA: II  Anesthesia Plan: General   Post-op Pain Management:    Induction: Intravenous  Airway Management Planned: Oral ETT  Additional Equipment:   Intra-op Plan:   Post-operative Plan: Extubation in OR  Informed Consent: I have reviewed the patients History and Physical, chart, labs and discussed the procedure including the risks, benefits and alternatives for the proposed anesthesia with the patient or authorized representative who has indicated his/her understanding and acceptance.     Plan Discussed with:   Anesthesia Plan Comments:         Anesthesia Quick Evaluation

## 2014-03-01 NOTE — Progress Notes (Signed)
Patient ID: Breanna Phillips, female   DOB: 04-16-1956, 58 y.o.   MRN: [id number]  Doing well post op Wants to go home Abdomen benign  Will discharge home

## 2014-03-01 NOTE — Discharge Instructions (Signed)
[facility] ______[facility] LAPAROSCOPIC SURGERY: POST OP INSTRUCTIONS Always review your discharge instruction sheet given to you by the facility where your surgery was performed. IF YOU HAVE DISABILITY OR FAMILY LEAVE FORMS, YOU MUST BRING THEM TO THE OFFICE FOR PROCESSING.   DO NOT GIVE THEM TO YOUR DOCTOR.  1. A prescription for pain medication may be given to you upon discharge.  Take your pain medication as prescribed, if needed.  If narcotic pain medicine is not needed, then you may take acetaminophen (Tylenol) or ibuprofen (Advil) as needed. 2. Take your usually prescribed medications unless otherwise directed. 3. If you need a refill on your pain medication, please contact your pharmacy.  They will contact our office to request authorization. Prescriptions will not be filled after 5pm or on week-ends. 4. You should follow a light diet the first few days after arrival home, such as soup and crackers, etc.  Be sure to include lots of fluids daily. 5. Most patients will experience some swelling and bruising in the area of the incisions.  Ice packs will help.  Swelling and bruising can take several days to resolve.  6. It is common to experience some constipation if taking pain medication after surgery.  Increasing fluid intake and taking a stool softener (such as Colace) will usually help or prevent this problem from occurring.  A mild laxative (Milk of Magnesia or Miralax) should be taken according to package instructions if there are no bowel movements after 48 hours. 7. Unless discharge instructions indicate otherwise, you may remove your bandages 24-48 hours after surgery, and you may shower at that time.  You may have steri-strips (small skin tapes) in place directly over the incision.  These strips should be left on the skin for 7-10 days.  If your surgeon used skin glue on the incision, you may shower in 24 hours.  The glue will flake off over the next 2-3 weeks.  Any sutures or  staples will be removed at the office during your follow-up visit. 8. ACTIVITIES:  You may resume regular (light) daily activities beginning the next day--such as daily self-care, walking, climbing stairs--gradually increasing activities as tolerated.  You may have sexual intercourse when it is comfortable.  Refrain from any heavy lifting or straining until approved by your doctor. a. You may drive when you are no longer taking prescription pain medication, you can comfortably wear a seatbelt, and you can safely maneuver your car and apply brakes. b. RETURN TO WORK:  _____when you feel like it_____________________________________________________ 9. You should see your doctor in the office for a follow-up appointment approximately 2-3 weeks after your surgery.  Make sure that you call for this appointment within a day or two after you arrive home to insure a convenient appointment time. 10. OTHER INSTRUCTIONS: __________________________________________________________________________________________________________________________ __________________________________________________________________________________________________________________________ WHEN TO CALL YOUR DOCTOR: 1. Fever over 101.0 2. Inability to urinate 3. Continued bleeding from incision. 4. Increased pain, redness, or drainage from the incision. 5. Increasing abdominal pain  The clinic staff is available to answer your questions during regular business hours.  Please dont hesitate to call and ask to speak to one of the nurses for clinical concerns.  If you have a medical emergency, go to the nearest emergency room or call 911.  A surgeon from [facility] is always on call at the hospital. [address], [address], [place], [place]  [address] ? [address] [address], [place], [place]   [address] [phone] ? [phone] ? FAX (  [phone]) [phone] Web site: [email or web address]

## 2014-03-01 NOTE — H&P (Signed)
Breanna Phillips March 10, 1956  [id number].   Chief Complaint/Reason for Consult: acute appendicitis HPI: This is a 58 yo otherwise healthy white female who suddenly awoke from sleep around 0100am with right sided abdominal pain.  She said it was always in her right lower quadrant and radiated some to her LLQ.  She had 2 episodes of emesis.  No diarrhea, fevers, or chills.  She presented to [facility] [address] [facility] where she underwent a workup that revealed a WBC of 22.7K and a CT scan showing acute appendicitis with no evidence of perforation.  We have been asked to evaluate for admission.  ROS: Please see HPI, otherwise all other systems have been reviewed and are negative  Family History  Problem Relation Age of Onset  . Heart disease    . Colon cancer Mother   . Depression Mother   . Esophageal cancer Neg Hx   . Stomach cancer Neg Hx   . Rectal cancer Neg Hx   . Alcohol abuse Father     Past Medical History  Diagnosis Date  . Personal history of colonic polyps 12/29/2007    hyperplastic polyp  . Diverticulosis of colon (without mention of hemorrhage)   . Other constipation   . Irritable bowel syndrome   . Raynaud's syndrome   . Esophageal reflux   . Hemorrhage of rectum and anus   . Family history of malignant neoplasm of gastrointestinal tract   . Esophageal reflux   . Internal hemorrhoids without mention of complication   . Depression     1st episode PPD 1994    Past Surgical History  Procedure Laterality Date  . Tubal ligation    . Abdominal hysterectomy    . Colonoscopy    . Polypectomy    . Cesarean section    . Leg surgery      Social History:  reports that she has been smoking.  She has never used smokeless tobacco. She reports that she drinks about 12.6 ounces of alcohol per week. She reports that she does not use illicit drugs.  Allergies: No Known Allergies   (Not in a hospital admission)  Blood pressure 118/83, pulse 84, temperature 97.4 F (36.3 C), temperature source  Oral, resp. rate 18, SpO2 98.00%. Physical Exam: General: pleasant, WD, WN white female who is laying in bed in NAD HEENT: head is normocephalic, atraumatic.  Sclera are noninjected.  PERRL.  Ears and nose without any masses or lesions.  Mouth is pink and moist Heart: regular, rate, and rhythm.  Normal s1,s2. No obvious murmurs, gallops, or rubs noted.  Palpable radial and pedal pulses bilaterally Lungs: CTAB, no wheezes, rhonchi, or rales noted.  Respiratory effort nonlabored Abd: soft, tender in RLQ at McBurney's point, ND, +BS, no masses, hernias, or organomegaly MS: all 4 extremities are symmetrical with no cyanosis, clubbing, or edema. Skin: warm and dry with no masses, lesions, or rashes Psych: A&Ox3 with an appropriate affect.    Results for orders placed during the hospital encounter of 03/01/14 (from the past 48 hour(s))  CBC WITH DIFFERENTIAL     Status: Abnormal   Collection Time    03/01/14  4:49 AM      Result Value Ref Range   WBC 22.7 (*) 4.0 - 10.5 K/uL   RBC 4.47  3.87 - 5.11 MIL/uL   Hemoglobin 14.2  12.0 - 15.0 g/dL   HCT 42.5  36.0 - 46.0 %   MCV 95.1  78.0 - 100.0 fL   MCH 31.8  26.0 - 34.0 pg   MCHC 33.4  30.0 - 36.0 g/dL   RDW 73.1  92.4 - 38.3 %   Platelets 216  150 - 400 K/uL   Neutrophils Relative % 83 (*) 43 - 77 %   Neutro Abs 18.9 (*) 1.7 - 7.7 K/uL   Lymphocytes Relative 12  12 - 46 %   Lymphs Abs 2.7  0.7 - 4.0 K/uL   Monocytes Relative 4  3 - 12 %   Monocytes Absolute 0.9  0.1 - 1.0 K/uL   Eosinophils Relative 1  0 - 5 %   Eosinophils Absolute 0.2  0.0 - 0.7 K/uL   Basophils Relative 0  0 - 1 %   Basophils Absolute 0.0  0.0 - 0.1 K/uL  BASIC METABOLIC PANEL     Status: Abnormal   Collection Time    03/01/14  4:49 AM      Result Value Ref Range   Sodium 140  137 - 147 mEq/L   Potassium 4.6  3.7 - 5.3 mEq/L   Chloride 101  96 - 112 mEq/L   CO2 26  19 - 32 mEq/L   Glucose, Bld 103 (*) 70 - 99 mg/dL   BUN 19  6 - 23 mg/dL   Creatinine, Ser  6.54  0.50 - 1.10 mg/dL   Calcium 9.5  8.4 - 27.1 mg/dL   GFR calc non Af Amer 63 (*) >90 mL/min   GFR calc Af Amer 73 (*) >90 mL/min   Comment: (NOTE)     The eGFR has been calculated using the CKD EPI equation.     This calculation has not been validated in all clinical situations.     eGFR's persistently <90 mL/min signify possible Chronic Kidney     Disease.  URINALYSIS, ROUTINE W REFLEX MICROSCOPIC     Status: Abnormal   Collection Time    03/01/14  4:50 AM      Result Value Ref Range   Color, Urine YELLOW  YELLOW   APPearance CLOUDY (*) CLEAR   Specific Gravity, Urine 1.017  1.005 - 1.030   pH 6.5  5.0 - 8.0   Glucose, UA NEGATIVE  NEGATIVE mg/dL   Hgb urine dipstick NEGATIVE  NEGATIVE   Bilirubin Urine NEGATIVE  NEGATIVE   Ketones, ur NEGATIVE  NEGATIVE mg/dL   Protein, ur NEGATIVE  NEGATIVE mg/dL   Urobilinogen, UA 0.2  0.0 - 1.0 mg/dL   Nitrite NEGATIVE  NEGATIVE   Leukocytes, UA NEGATIVE  NEGATIVE   Comment: MICROSCOPIC NOT DONE ON URINES WITH NEGATIVE PROTEIN, BLOOD, LEUKOCYTES, NITRITE, OR GLUCOSE <1000 mg/dL.   Ct Abdomen Pelvis W Contrast  03/01/2014   CLINICAL DATA:  Abdominal pain.  Right lower quadrant pain.  EXAM: CT ABDOMEN AND PELVIS WITH CONTRAST  TECHNIQUE: Multidetector CT imaging of the abdomen and pelvis was performed using the standard protocol following bolus administration of intravenous contrast.  CONTRAST:  90mL OMNIPAQUE IOHEXOL 300 MG/ML  SOLN  COMPARISON:  05/10/2013.  FINDINGS: Unremarkable lung bases. Bilateral breast implants. Grossly unremarkable cardiac morphology.  Normal liver, spleen, pancreas, adrenal glands, and gallbladder. No biliary ductal dilatation. No bowel obstruction. No renal calculi, masses, or hydronephrosis.  In the right lower quadrant, the appendix is enlarged and inflamed. There is moderate periappendiceal stranding without frank perforation. Findings consistent with acute appendicitis.  Hysterectomy. Colonic diverticulosis without  diverticulitis. No adnexal masses. No pathologic adenopathy. Unremarkable aorta and major branches. No osseous lesions.  Compared with prior, the  findings today are acute.  IMPRESSION: Acute appendicitis.  Findings discussed with ordering provider.   Electronically Signed   By: Rolla Flatten M.D.   On: 03/01/2014 07:13       Assessment/Plan 1. Acute appendicitis  Plan: 1. Admit and plan for OR today for lap appy.  She is being given a dose a zosyn right now.  I have discussed the procedure, risks and complications, expected recovery and outcome with the patient and her boyfriend.  They both understand and are agreeable to proceed with surgery.  Henreitta Cea 03/01/2014, 8:09 AM Pager: [phone]

## 2014-03-01 NOTE — H&P (Signed)
I have seen and examined the patient and agree with the assessment and plans.  Will proceed with a lap appendectomy.  The risks were discussed.  These include, but are not limited to bleeding, infection, injury to surrounding structures, stump leak, need for further surgery, post op recovery, etc. She agrees to proceed.  Anant Agard A. Magnus IvanBlackman  MD, FACS

## 2014-03-01 NOTE — ED Provider Notes (Signed)
CSN: [id number]     Arrival date & time 03/01/14  0358 History   First MD Initiated Contact with Patient 03/01/14 [phone]     Chief Complaint  Patient presents with  . Abdominal Pain      HPI  Vision presents with lower abdominal pain. She states approximately 1 AM she was awakened by her husband. She states that she started with pain in her lower abdomen. Right greater than left and somewhat suprapubic constant but at times would be somewhat worse than others. It is worse with movement. Had an episode of nausea and vomiting prior to arrival here. No back or flank pain.  History of a colonoscopy at age 89 because her mother had colon cancer.  She was told she had diverticuli. Status post laprascopic hysterectomy and oophorectomy.  Past Medical History  Diagnosis Date  . Personal history of colonic polyps 12/29/2007    hyperplastic polyp  . Diverticulosis of colon (without mention of hemorrhage)   . Other constipation   . Irritable bowel syndrome   . Raynaud's syndrome   . Esophageal reflux   . Hemorrhage of rectum and anus   . Family history of malignant neoplasm of gastrointestinal tract   . Esophageal reflux   . Internal hemorrhoids without mention of complication   . Depression     1st episode PPD 1994   Past Surgical History  Procedure Laterality Date  . Tubal ligation    . Abdominal hysterectomy    . Colonoscopy    . Polypectomy     Family History  Problem Relation Age of Onset  . Heart disease    . Colon cancer Mother   . Depression Mother   . Esophageal cancer Neg Hx   . Stomach cancer Neg Hx   . Rectal cancer Neg Hx   . Alcohol abuse Father    History  Substance Use Topics  . Smoking status: Former Games developermoker  . Smokeless tobacco: Never Used  . Alcohol Use: 7.5 oz/week    15 drink(s) per week   OB History   Grav Para Term Preterm Abortions TAB SAB Ect Mult Living                 Review of Systems  Constitutional: Negative for fever, chills, diaphoresis,  appetite change and fatigue.  HENT: Negative for mouth sores, sore throat and trouble swallowing.   Eyes: Negative for visual disturbance.  Respiratory: Negative for cough, chest tightness, shortness of breath and wheezing.   Cardiovascular: Negative for chest pain.  Gastrointestinal: Positive for nausea, vomiting and abdominal distention. Negative for abdominal pain and diarrhea.  Endocrine: Negative for polydipsia, polyphagia and polyuria.  Genitourinary: Negative for dysuria, frequency and hematuria.  Musculoskeletal: Negative for gait problem.  Skin: Negative for color change, pallor and rash.  Neurological: Negative for dizziness, syncope, light-headedness and headaches.  Hematological: Does not bruise/bleed easily.  Psychiatric/Behavioral: Negative for behavioral problems and confusion.      Allergies  Review of patient's allergies indicates no known allergies.  Home Medications   Prior to Admission medications   Medication Sig Start Date End Date Taking? Authorizing Provider  calcium citrate-vitamin D (CITRACAL+D) 315-200 MG-UNIT per tablet Take 1 tablet by mouth daily.     Yes Historical Provider, MD  cholecalciferol (VITAMIN D) 1000 UNITS tablet Take 2,000 Units by mouth daily.    Yes Historical Provider, MD  estradiol (VIVELLE-DOT) 0.05 MG/24HR patch Place 1 patch onto the skin 2 (two) times a week.  Yes Historical Provider, MD  loratadine (CLARITIN) 10 MG tablet Take 10 mg by mouth daily.    Yes Historical Provider, MD  magnesium oxide (MAGNESIUM-OXIDE) 400 (241.3 MG) MG tablet Take 400 mg by mouth daily.   Yes Historical Provider, MD  Omega-3 Fatty Acids (FISH OIL) 1000 MG CAPS Take 1 capsule by mouth daily.     Yes Historical Provider, MD  zolpidem (AMBIEN) 10 MG tablet Take 5 mg by mouth at bedtime as needed for sleep.    Yes Judith Blonderarol G Curtis, NP   BP 118/83  Pulse 84  Temp(Src) 97.4 F (36.3 C) (Oral)  Resp 18  SpO2 98% Physical Exam  Constitutional: She is  oriented to person, place, and time. She appears well-developed and well-nourished. No distress.  HENT:  Head: Normocephalic.  Eyes: Conjunctivae are normal. Pupils are equal, round, and reactive to light. No scleral icterus.  Neck: Normal range of motion. Neck supple. No thyromegaly present.  Cardiovascular: Normal rate and regular rhythm.  Exam reveals no gallop and no friction rub.   No murmur heard. Pulmonary/Chest: Effort normal and breath sounds normal. No respiratory distress. She has no wheezes. She has no rales.  Abdominal: Soft. Bowel sounds are normal. She exhibits no distension. There is no tenderness. There is no rebound.    Musculoskeletal: Normal range of motion.  Neurological: She is alert and oriented to person, place, and time.  Skin: Skin is warm and dry. No rash noted.  Psychiatric: She has a normal mood and affect. Her behavior is normal.    ED Course  Procedures (including critical care time) Labs Review Labs Reviewed  CBC WITH DIFFERENTIAL - Abnormal; Notable for the following:    WBC 22.7 (*)    Neutrophils Relative % 83 (*)    Neutro Abs 18.9 (*)    All other components within normal limits  BASIC METABOLIC PANEL - Abnormal; Notable for the following:    Glucose, Bld 103 (*)    GFR calc non Af Amer 63 (*)    GFR calc Af Amer 73 (*)    All other components within normal limits  URINALYSIS, ROUTINE W REFLEX MICROSCOPIC - Abnormal; Notable for the following:    APPearance CLOUDY (*)    All other components within normal limits    Imaging Review No results found.   EKG Interpretation None      MDM   Final diagnoses:  None    Urine shows no signs of infection or blood. Elevated white blood cell count. Exam suggests possible appendicitis, or diverticulitis. Pain is more right pelvic than abdominal. CT pending.  07:21:  Per radiologist, Appendix enlarged and inflamed.  No perforation or abscess. Call placed to gen surgery.    Rolland PorterMark Harkirat Orozco,  MD 03/01/14 [phone]

## 2014-03-01 NOTE — Anesthesia Postprocedure Evaluation (Signed)
  Anesthesia Post-op Note  Patient: Breanna Phillips  Procedure(s) Performed: Procedure(s): APPENDECTOMY LAPAROSCOPIC (N/A)  Patient Location: PACU  Anesthesia Type:General  Level of Consciousness: awake, alert , oriented and patient cooperative  Airway and Oxygen Therapy: Patient Spontanous Breathing  Post-op Pain: mild  Post-op Assessment: Post-op Vital signs reviewed, Patient's Cardiovascular Status Stable, Respiratory Function Stable, Patent Airway, No signs of Nausea or vomiting and Pain level controlled  Post-op Vital Signs: stable  Last Vitals:  Filed Vitals:   03/01/14 1415  BP: 134/65  Pulse: 80  Temp: 36.6 C  Resp:     Complications: No apparent anesthesia complications

## 2014-03-01 NOTE — ED Notes (Signed)
Pt c/o lower rt abd pain that radiates to center and epigastric area. Pt rates pain 7/10. Pt states pain suddenly started at 0100.  Pt describes pain as constant. States she had some n/v prior to arrival.

## 2014-03-01 NOTE — Progress Notes (Signed)
Patient admitted to [facility] room 3. S/P lap appendectomy. Oriented to room. Call bell in reach. Family at bedside

## 2014-03-01 NOTE — Progress Notes (Signed)
Discharge instructions reviewed with patient and significant other. Patient verbalizes understanding and able to answer teach-back questions. Printed AVS and prescription given to patient. Patient discharged to home. Accompanied by significant other and family friend.

## 2014-03-01 NOTE — Anesthesia Procedure Notes (Signed)
Procedure Name: Intubation Date/Time: 03/01/2014 1:06 PM Performed by: Orvilla FusATO, Ziere Docken A Pre-anesthesia Checklist: Timeout performed, Patient identified, Emergency Drugs available, Suction available and Patient being monitored Patient Re-evaluated:Patient Re-evaluated prior to inductionOxygen Delivery Method: Circle system utilized Preoxygenation: Pre-oxygenation with 100% oxygen Intubation Type: IV induction Ventilation: Mask ventilation without difficulty Laryngoscope Size: Mac and 3 Grade View: Grade I Tube type: Oral Tube size: 7.0 mm Number of attempts: 1 Airway Equipment and Method: Stylet Placement Confirmation: ETT inserted through vocal cords under direct vision,  breath sounds checked- equal and bilateral and positive ETCO2 Secured at: 21 cm Tube secured with: Tape Dental Injury: Teeth and Oropharynx as per pre-operative assessment

## 2014-03-01 NOTE — Op Note (Signed)
Appendectomy, Lap, Procedure Note  Indications: The patient presented with a history of right-sided abdominal pain. A CT revealed findings consistent with acute appendicitis.  Pre-operative Diagnosis: Acute appendicitis without mention of peritonitis  Post-operative Diagnosis: Same  Surgeon: Shelly Rubensteinouglas A Debroh Sieloff   Assistants: 0  Anesthesia: General endotracheal anesthesia  ASA Class: 1  Procedure Details  The patient was seen again in the Holding Room. The risks, benefits, complications, treatment options, and expected outcomes were discussed with the patient and/or family. The possibilities of reaction to medication, perforation of viscus, bleeding, recurrent infection, finding a normal appendix, the need for additional procedures, failure to diagnose a condition, and creating a complication requiring transfusion or operation were discussed. There was concurrence with the proposed plan and informed consent was obtained. The site of surgery was properly noted. The patient was taken to Operating Room, identified as [place] Senate[facility]ancy C Mccollom and the procedure verified as Appendectomy. A Time Out was held and the above information confirmed.  The patient was placed in the supine position and general anesthesia was induced, along with placement of orogastric tube, Venodyne boots, and a Foley catheter. The abdomen was prepped and draped in a sterile fashion. A one centimeter infraumbilical incision was made.  The umbilical stalk was elevated, and the midline fascia was incised with a #11 blade.  A Kelly clamp was used to confirm entrance into the peritoneal cavity.  A pursestring suture was passed around the incision with a 0 Vicryl.  The Hasson was introduced into the abdomen and the tails of the suture were used to hold the Hasson in place.   The pneumoperitoneum was then established to steady pressure of 15 mmHg.  Additional 5 mm cannulas then placed in the left lower quadrant of the abdomen and the  suprapubic region under direct visualization. A careful evaluation of the entire abdomen was carried out. The patient was placed in Trendelenburg and left lateral decubitus position. The small intestines were retracted in the cephalad and left lateral direction away from the pelvis and right lower quadrant. The patient was found to have an enlarged and inflamed appendix that was extending into the pelvis. There was no evidence of perforation.  The appendix was carefully dissected. The appendix was was skeletonized with the harmonic scalpel.   The appendix was divided at its base using an endo-GIA stapler. Minimal appendiceal stump was left in place. I had to control an area of bleeding at the stump mesentery with 2 staples.  Hemostasis appeared to be achieved. There was no evidence of bleeding, leakage, or complication after division of the appendix. Irrigation was also performed and irrigate suctioned from the abdomen as well.  The umbilical port site was closed with the purse string suture. There was no residual palpable fascial defect.  The trocar site skin wounds were closed with 4-0 Monocryl.  Instrument, sponge, and needle counts were correct at the conclusion of the case.   Findings: The appendix was found to be inflamed. There were not signs of necrosis.  There was not perforation. There was not abscess formation.  Estimated Blood Loss:  Minimal         Drains:none         Complications:  None; patient tolerated the procedure well.         Disposition: PACU - hemodynamically stable.         Condition: stable

## 2014-03-03 ENCOUNTER — Ambulatory Visit: Payer: Self-pay | Admitting: Sports Medicine

## 2014-03-03 ENCOUNTER — Encounter (HOSPITAL_COMMUNITY): Payer: Self-pay | Admitting: Surgery

## 2014-03-16 ENCOUNTER — Encounter (INDEPENDENT_AMBULATORY_CARE_PROVIDER_SITE_OTHER): Payer: BC Managed Care – PPO | Admitting: Surgery

## 2014-03-18 ENCOUNTER — Ambulatory Visit (INDEPENDENT_AMBULATORY_CARE_PROVIDER_SITE_OTHER): Payer: BC Managed Care – PPO | Admitting: Surgery

## 2014-03-18 ENCOUNTER — Encounter (INDEPENDENT_AMBULATORY_CARE_PROVIDER_SITE_OTHER): Payer: Self-pay | Admitting: Surgery

## 2014-03-18 VITALS — BP 126/80 | HR 70 | Temp 97.8°F | Resp 16 | Ht 62.0 in | Wt 128.8 lb

## 2014-03-18 DIAGNOSIS — Z09 Encounter for follow-up examination after completed treatment for conditions other than malignant neoplasm: Secondary | ICD-10-CM

## 2014-03-18 NOTE — Progress Notes (Signed)
Subjective:     Patient ID: Breanna Phillips, female   DOB: Apr 05, 1956, 58 y.o.   MRN: [id number]  HPI She is here for her first postop visit status post laparoscopic appendectomy. She is doing well has no complaints  Review of Systems     Objective:   Physical Exam On exam, her incisions are well-healed. The final pathology showed acute appendicitis    Assessment:     Patient stable postop     Plan:     She will resume her normal activities. I will see her back as needed

## 2014-03-21 ENCOUNTER — Encounter (INDEPENDENT_AMBULATORY_CARE_PROVIDER_SITE_OTHER): Payer: BC Managed Care – PPO | Admitting: Surgery

## 2014-06-13 ENCOUNTER — Encounter: Payer: Self-pay | Admitting: Gastroenterology

## 2015-01-09 ENCOUNTER — Other Ambulatory Visit: Payer: Self-pay | Admitting: Obstetrics and Gynecology

## 2015-01-10 LAB — CYTOLOGY - PAP

## 2015-01-20 ENCOUNTER — Other Ambulatory Visit: Payer: Self-pay | Admitting: Obstetrics and Gynecology

## 2015-06-12 ENCOUNTER — Other Ambulatory Visit (HOSPITAL_COMMUNITY): Payer: Self-pay | Admitting: Obstetrics & Gynecology

## 2015-06-12 DIAGNOSIS — Z1231 Encounter for screening mammogram for malignant neoplasm of breast: Secondary | ICD-10-CM

## 2015-06-27 ENCOUNTER — Ambulatory Visit (HOSPITAL_COMMUNITY)
Admission: RE | Admit: 2015-06-27 | Discharge: 2015-06-27 | Disposition: A | Discharge status: Home / Self Care | Payer: 59 | Source: Ambulatory Visit | Attending: Obstetrics & Gynecology | Admitting: Obstetrics & Gynecology

## 2015-06-27 ENCOUNTER — Other Ambulatory Visit (HOSPITAL_COMMUNITY): Payer: Self-pay | Admitting: Obstetrics & Gynecology

## 2015-06-27 DIAGNOSIS — Z1231 Encounter for screening mammogram for malignant neoplasm of breast: Secondary | ICD-10-CM | POA: Insufficient documentation

## 2015-07-25 ENCOUNTER — Other Ambulatory Visit: Payer: Self-pay | Admitting: Obstetrics and Gynecology

## 2015-07-26 LAB — CYTOLOGY - PAP

## 2016-02-22 DIAGNOSIS — D225 Melanocytic nevi of trunk: Secondary | ICD-10-CM | POA: Diagnosis not present

## 2016-02-22 DIAGNOSIS — L570 Actinic keratosis: Secondary | ICD-10-CM | POA: Diagnosis not present

## 2016-02-22 DIAGNOSIS — L821 Other seborrheic keratosis: Secondary | ICD-10-CM | POA: Diagnosis not present

## 2016-03-10 IMAGING — MG MM DIGITAL SCREENING W/ IMPLANTS BILATERAL
8 series · 8 of 8 positions shown · non-contrast
Comparison: Previous exam(s).

CLINICAL DATA: Screening.

EXAM:
DIGITAL SCREENING BILATERAL MAMMOGRAM WITH IMPLANTS AND CAD
The patient has retropectoral implants. Standard and implant
displaced views were performed.

[R MLO (1 of 2)]
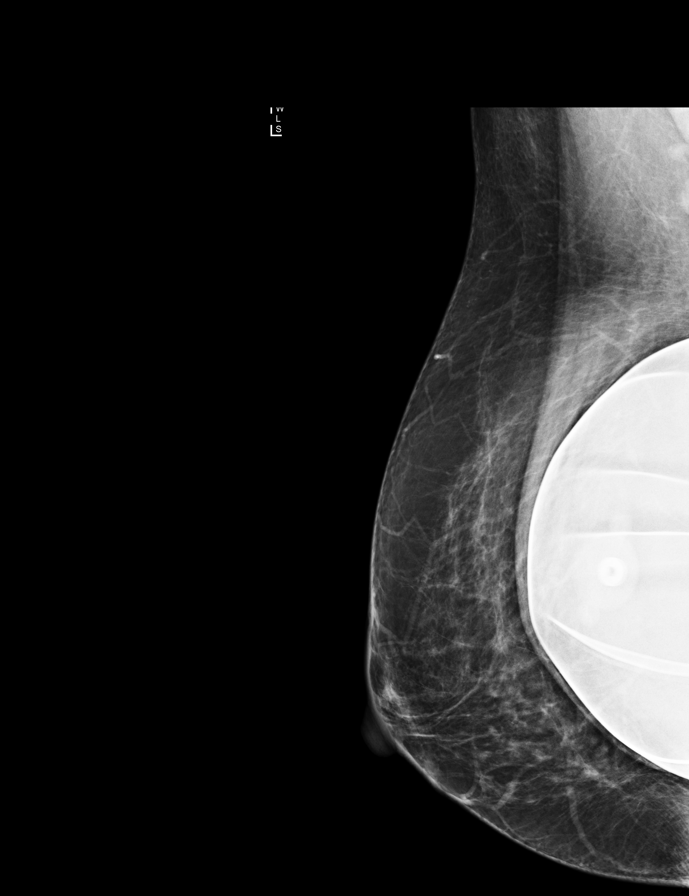

[R CC (1 of 2)]
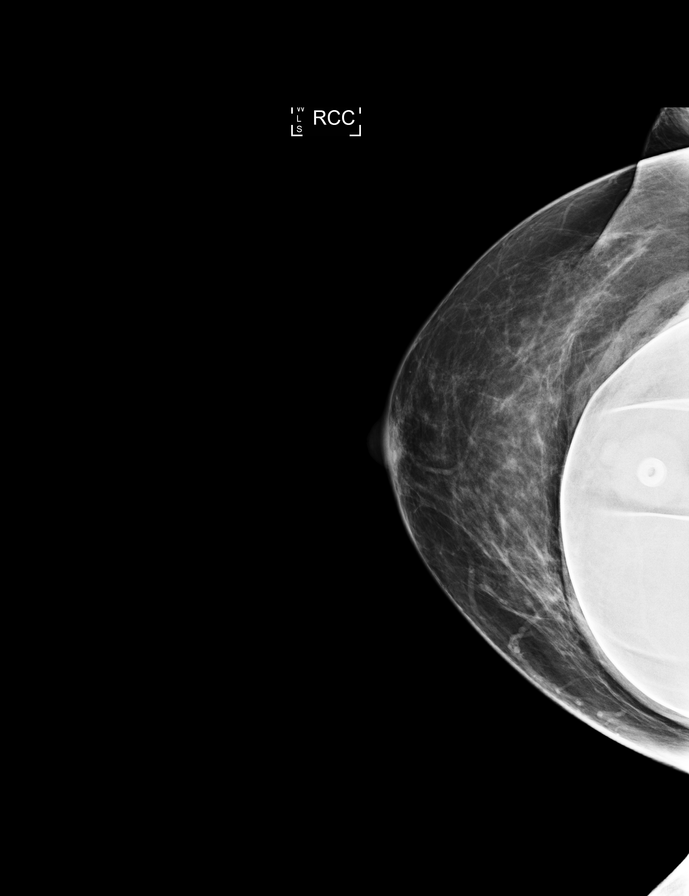

[L CC (1 of 2)]
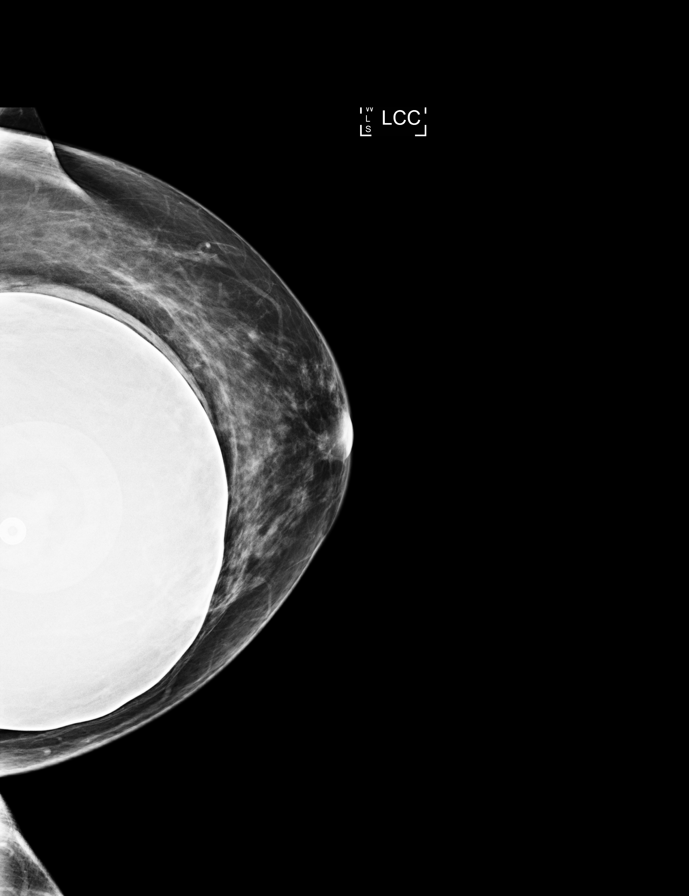

[L MLO (1 of 2)]
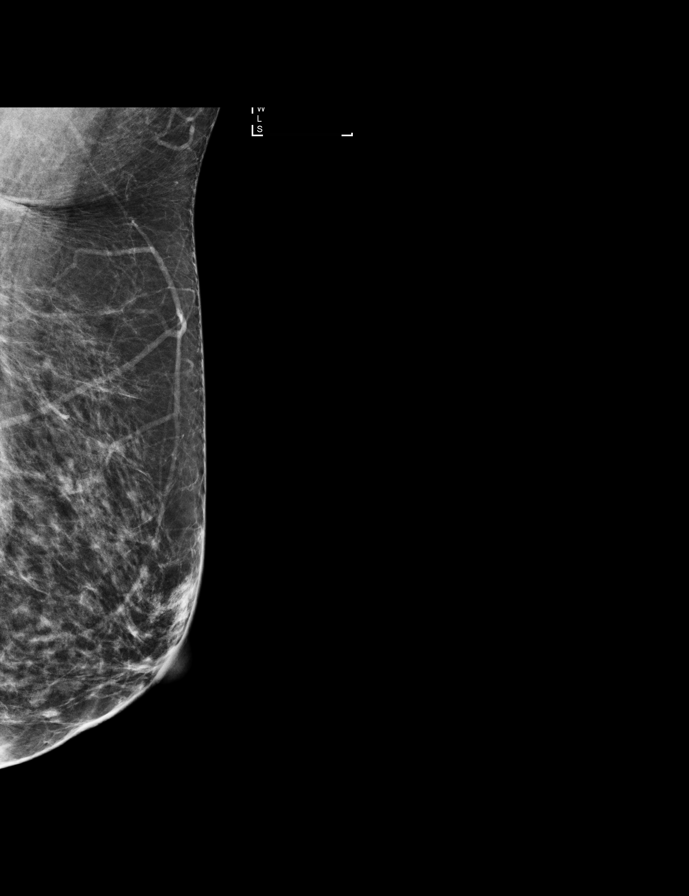

[R CC (2 of 2)]
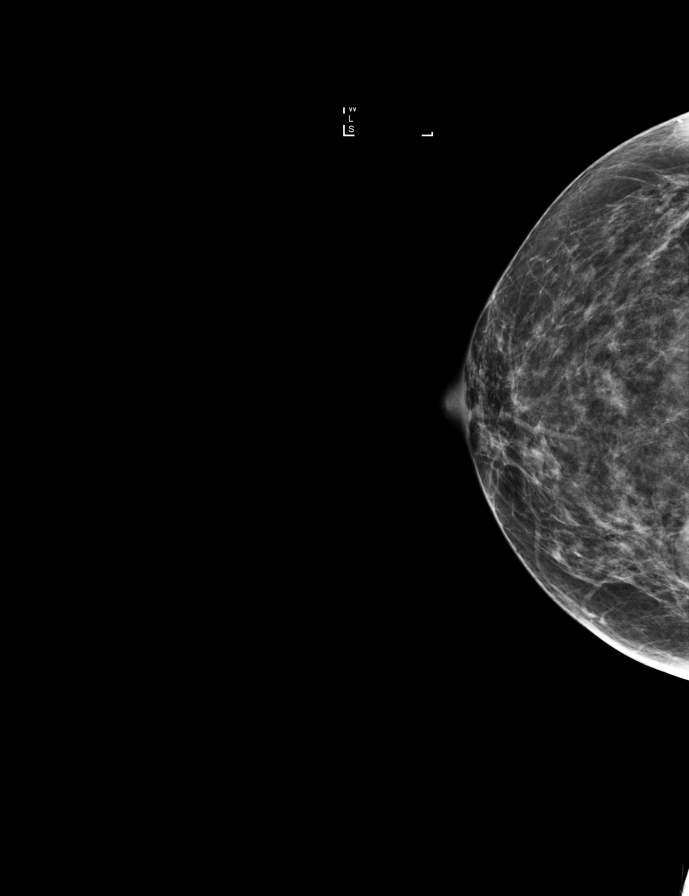

[L MLO (2 of 2)]
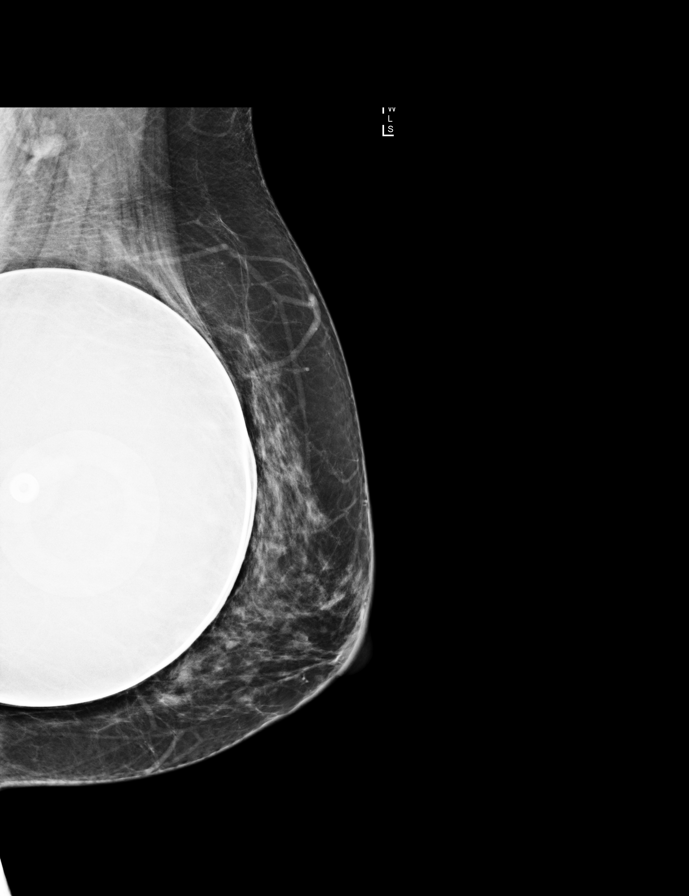

[R MLO (2 of 2)]
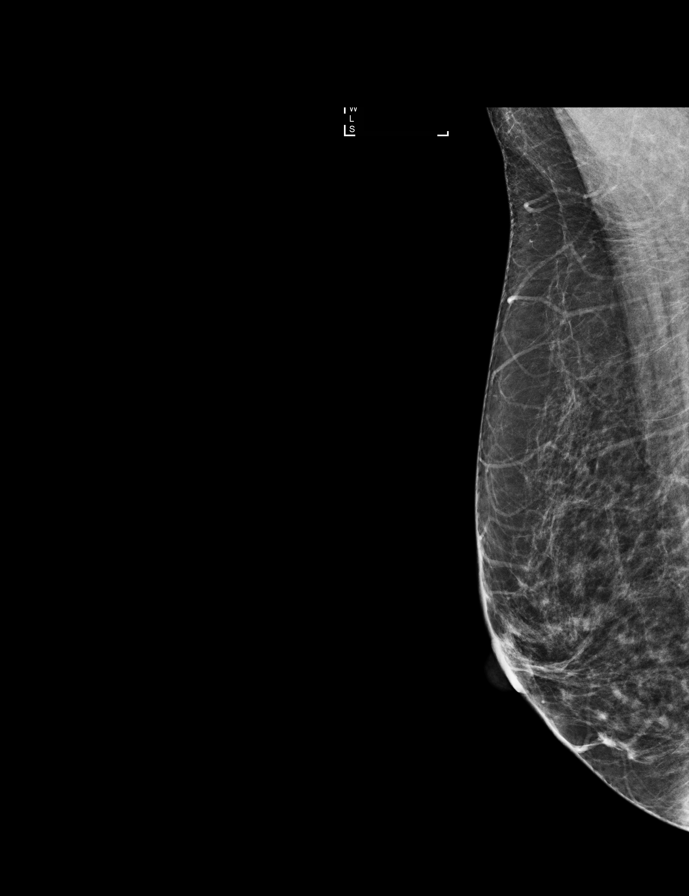

[L CC (2 of 2)]
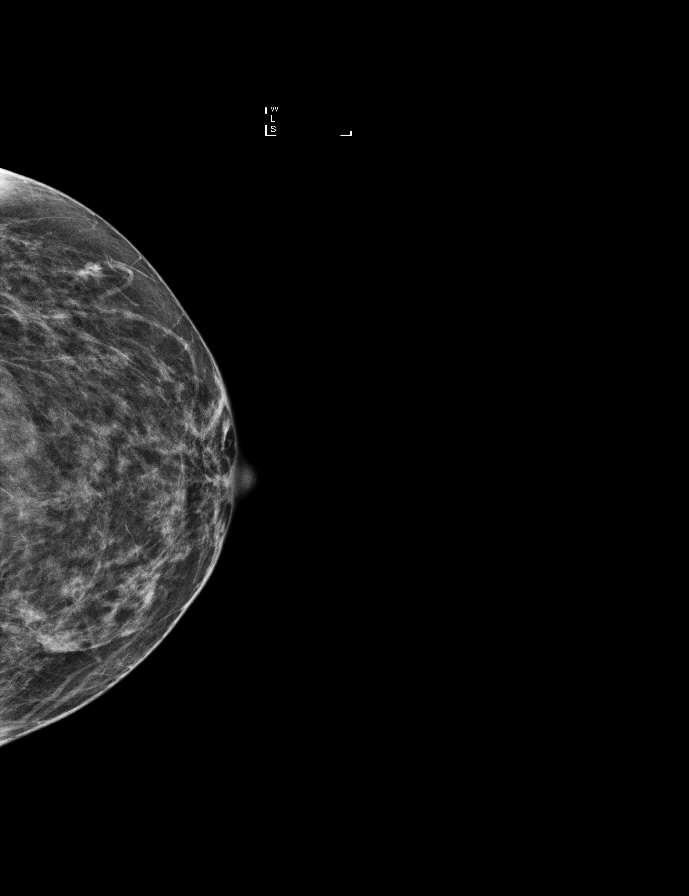

[8 of 8 positions shown; findings below may reference images not displayed]

ACR Breast Density Category b: There are scattered areas of
fibroglandular density.
FINDINGS: There are no findings suspicious for malignancy. Bilateral
retropectoral saline implants are intact. Images were processed with
CAD.
IMPRESSION: No mammographic evidence of malignancy. A result letter of this
screening mammogram will be mailed directly to the patient.

RECOMMENDATION:
Screening mammogram in one year. (Code:0M-[id number])

BI-RADS CATEGORY  1:  Negative.

## 2016-05-24 DIAGNOSIS — J22 Unspecified acute lower respiratory infection: Secondary | ICD-10-CM | POA: Diagnosis not present

## 2016-05-24 DIAGNOSIS — J019 Acute sinusitis, unspecified: Secondary | ICD-10-CM | POA: Diagnosis not present

## 2016-05-24 DIAGNOSIS — R0602 Shortness of breath: Secondary | ICD-10-CM | POA: Diagnosis not present

## 2016-05-24 DIAGNOSIS — B9689 Other specified bacterial agents as the cause of diseases classified elsewhere: Secondary | ICD-10-CM | POA: Diagnosis not present

## 2016-07-03 DIAGNOSIS — Z01419 Encounter for gynecological examination (general) (routine) without abnormal findings: Secondary | ICD-10-CM | POA: Diagnosis not present

## 2016-07-03 DIAGNOSIS — Z6823 Body mass index (BMI) 23.0-23.9, adult: Secondary | ICD-10-CM | POA: Diagnosis not present

## 2016-07-03 DIAGNOSIS — R87620 Atypical squamous cells of undetermined significance on cytologic smear of vagina (ASC-US): Secondary | ICD-10-CM | POA: Diagnosis not present

## 2016-07-03 DIAGNOSIS — Z113 Encounter for screening for infections with a predominantly sexual mode of transmission: Secondary | ICD-10-CM | POA: Diagnosis not present

## 2016-10-18 DIAGNOSIS — K5900 Constipation, unspecified: Secondary | ICD-10-CM | POA: Diagnosis not present

## 2016-11-15 DIAGNOSIS — Z1231 Encounter for screening mammogram for malignant neoplasm of breast: Secondary | ICD-10-CM | POA: Diagnosis not present

## 2017-02-03 DIAGNOSIS — R87810 Cervical high risk human papillomavirus (HPV) DNA test positive: Secondary | ICD-10-CM | POA: Diagnosis not present

## 2017-02-03 DIAGNOSIS — Z0142 Encounter for cervical smear to confirm findings of recent normal smear following initial abnormal smear: Secondary | ICD-10-CM | POA: Diagnosis not present

## 2017-02-03 DIAGNOSIS — R87610 Atypical squamous cells of undetermined significance on cytologic smear of cervix (ASC-US): Secondary | ICD-10-CM | POA: Diagnosis not present

## 2017-03-11 DIAGNOSIS — M7711 Lateral epicondylitis, right elbow: Secondary | ICD-10-CM | POA: Diagnosis not present

## 2017-06-02 DIAGNOSIS — E559 Vitamin D deficiency, unspecified: Secondary | ICD-10-CM | POA: Diagnosis not present

## 2017-06-02 DIAGNOSIS — E7800 Pure hypercholesterolemia, unspecified: Secondary | ICD-10-CM | POA: Diagnosis not present

## 2017-06-02 DIAGNOSIS — Z0000 Encounter for general adult medical examination without abnormal findings: Secondary | ICD-10-CM | POA: Diagnosis not present

## 2017-06-02 DIAGNOSIS — I10 Essential (primary) hypertension: Secondary | ICD-10-CM | POA: Diagnosis not present

## 2017-06-04 DIAGNOSIS — Z0000 Encounter for general adult medical examination without abnormal findings: Secondary | ICD-10-CM | POA: Diagnosis not present

## 2017-08-16 DIAGNOSIS — Z23 Encounter for immunization: Secondary | ICD-10-CM | POA: Diagnosis not present

## 2017-09-08 DIAGNOSIS — Z800 Family history of malignant neoplasm of digestive organs: Secondary | ICD-10-CM | POA: Diagnosis not present

## 2017-09-08 DIAGNOSIS — K5730 Diverticulosis of large intestine without perforation or abscess without bleeding: Secondary | ICD-10-CM | POA: Diagnosis not present

## 2017-09-08 DIAGNOSIS — Z1211 Encounter for screening for malignant neoplasm of colon: Secondary | ICD-10-CM | POA: Diagnosis not present

## 2017-09-08 DIAGNOSIS — Z8719 Personal history of other diseases of the digestive system: Secondary | ICD-10-CM | POA: Diagnosis not present

## 2017-09-08 DIAGNOSIS — K648 Other hemorrhoids: Secondary | ICD-10-CM | POA: Diagnosis not present

## 2017-09-15 DIAGNOSIS — I10 Essential (primary) hypertension: Secondary | ICD-10-CM | POA: Diagnosis not present

## 2017-09-15 DIAGNOSIS — G4700 Insomnia, unspecified: Secondary | ICD-10-CM | POA: Diagnosis not present

## 2017-09-15 DIAGNOSIS — R87619 Unspecified abnormal cytological findings in specimens from cervix uteri: Secondary | ICD-10-CM | POA: Diagnosis not present

## 2017-09-22 DIAGNOSIS — I83811 Varicose veins of right lower extremities with pain: Secondary | ICD-10-CM | POA: Diagnosis not present

## 2017-09-23 DIAGNOSIS — I83811 Varicose veins of right lower extremities with pain: Secondary | ICD-10-CM | POA: Diagnosis not present

## 2017-09-30 DIAGNOSIS — I83811 Varicose veins of right lower extremities with pain: Secondary | ICD-10-CM | POA: Diagnosis not present

## 2017-10-07 DIAGNOSIS — M25562 Pain in left knee: Secondary | ICD-10-CM | POA: Diagnosis not present

## 2017-12-15 DIAGNOSIS — Z6824 Body mass index (BMI) 24.0-24.9, adult: Secondary | ICD-10-CM | POA: Diagnosis not present

## 2017-12-15 DIAGNOSIS — Z01419 Encounter for gynecological examination (general) (routine) without abnormal findings: Secondary | ICD-10-CM | POA: Diagnosis not present

## 2018-01-27 DIAGNOSIS — M25561 Pain in right knee: Secondary | ICD-10-CM | POA: Diagnosis not present

## 2018-02-09 DIAGNOSIS — Z13820 Encounter for screening for osteoporosis: Secondary | ICD-10-CM | POA: Diagnosis not present

## 2018-02-09 DIAGNOSIS — Z1231 Encounter for screening mammogram for malignant neoplasm of breast: Secondary | ICD-10-CM | POA: Diagnosis not present

## 2018-02-26 DIAGNOSIS — D2271 Melanocytic nevi of right lower limb, including hip: Secondary | ICD-10-CM | POA: Diagnosis not present

## 2018-02-26 DIAGNOSIS — L821 Other seborrheic keratosis: Secondary | ICD-10-CM | POA: Diagnosis not present

## 2018-02-26 DIAGNOSIS — D225 Melanocytic nevi of trunk: Secondary | ICD-10-CM | POA: Diagnosis not present

## 2018-02-26 DIAGNOSIS — L570 Actinic keratosis: Secondary | ICD-10-CM | POA: Diagnosis not present

## 2018-03-17 DIAGNOSIS — Z23 Encounter for immunization: Secondary | ICD-10-CM | POA: Diagnosis not present

## 2018-04-12 DIAGNOSIS — I10 Essential (primary) hypertension: Secondary | ICD-10-CM | POA: Diagnosis not present

## 2018-04-20 DIAGNOSIS — M25561 Pain in right knee: Secondary | ICD-10-CM | POA: Diagnosis not present

## 2018-04-20 DIAGNOSIS — M1711 Unilateral primary osteoarthritis, right knee: Secondary | ICD-10-CM | POA: Diagnosis not present

## 2018-04-29 DIAGNOSIS — I10 Essential (primary) hypertension: Secondary | ICD-10-CM | POA: Diagnosis not present

## 2018-04-29 DIAGNOSIS — G4700 Insomnia, unspecified: Secondary | ICD-10-CM | POA: Diagnosis not present

## 2018-04-29 DIAGNOSIS — E7800 Pure hypercholesterolemia, unspecified: Secondary | ICD-10-CM | POA: Diagnosis not present

## 2018-06-09 DIAGNOSIS — M1711 Unilateral primary osteoarthritis, right knee: Secondary | ICD-10-CM | POA: Diagnosis not present

## 2018-06-11 DIAGNOSIS — T1512XA Foreign body in conjunctival sac, left eye, initial encounter: Secondary | ICD-10-CM | POA: Diagnosis not present

## 2018-06-11 DIAGNOSIS — H5712 Ocular pain, left eye: Secondary | ICD-10-CM | POA: Diagnosis not present

## 2018-06-11 DIAGNOSIS — H16102 Unspecified superficial keratitis, left eye: Secondary | ICD-10-CM | POA: Diagnosis not present

## 2018-07-25 DIAGNOSIS — R51 Headache: Secondary | ICD-10-CM | POA: Diagnosis not present

## 2018-07-25 DIAGNOSIS — F411 Generalized anxiety disorder: Secondary | ICD-10-CM | POA: Diagnosis not present

## 2018-07-25 DIAGNOSIS — I10 Essential (primary) hypertension: Secondary | ICD-10-CM | POA: Diagnosis not present

## 2018-08-06 DIAGNOSIS — J029 Acute pharyngitis, unspecified: Secondary | ICD-10-CM | POA: Diagnosis not present

## 2018-08-06 DIAGNOSIS — J069 Acute upper respiratory infection, unspecified: Secondary | ICD-10-CM | POA: Diagnosis not present

## 2018-09-03 DIAGNOSIS — Z23 Encounter for immunization: Secondary | ICD-10-CM | POA: Diagnosis not present

## 2018-09-03 DIAGNOSIS — L570 Actinic keratosis: Secondary | ICD-10-CM | POA: Diagnosis not present

## 2018-09-22 DIAGNOSIS — M1711 Unilateral primary osteoarthritis, right knee: Secondary | ICD-10-CM | POA: Diagnosis not present

## 2018-09-22 DIAGNOSIS — M1712 Unilateral primary osteoarthritis, left knee: Secondary | ICD-10-CM | POA: Diagnosis not present

## 2018-11-02 DIAGNOSIS — L821 Other seborrheic keratosis: Secondary | ICD-10-CM | POA: Diagnosis not present

## 2018-11-02 DIAGNOSIS — Z23 Encounter for immunization: Secondary | ICD-10-CM | POA: Diagnosis not present

## 2018-11-02 DIAGNOSIS — L720 Epidermal cyst: Secondary | ICD-10-CM | POA: Diagnosis not present

## 2018-11-09 DIAGNOSIS — E7800 Pure hypercholesterolemia, unspecified: Secondary | ICD-10-CM | POA: Diagnosis not present

## 2018-11-09 DIAGNOSIS — I10 Essential (primary) hypertension: Secondary | ICD-10-CM | POA: Diagnosis not present

## 2019-11-15 ENCOUNTER — Ambulatory Visit: Payer: Self-pay | Admitting: Orthopaedic Surgery

## 2019-11-22 ENCOUNTER — Ambulatory Visit (INDEPENDENT_AMBULATORY_CARE_PROVIDER_SITE_OTHER): Payer: 59

## 2019-11-22 ENCOUNTER — Telehealth: Payer: Self-pay | Admitting: Radiology

## 2019-11-22 ENCOUNTER — Encounter: Payer: Self-pay | Admitting: Orthopaedic Surgery

## 2019-11-22 ENCOUNTER — Other Ambulatory Visit: Payer: Self-pay

## 2019-11-22 ENCOUNTER — Ambulatory Visit (INDEPENDENT_AMBULATORY_CARE_PROVIDER_SITE_OTHER): Payer: 59 | Admitting: Orthopaedic Surgery

## 2019-11-22 DIAGNOSIS — M25561 Pain in right knee: Secondary | ICD-10-CM

## 2019-11-22 DIAGNOSIS — G8929 Other chronic pain: Secondary | ICD-10-CM
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
## 2019-11-22 NOTE — Telephone Encounter (Signed)
Please obtain authorization for gel injection right knee-C Magnus Ivan

## 2019-11-24 ENCOUNTER — Other Ambulatory Visit: Payer: Self-pay | Admitting: Obstetrics & Gynecology

## 2019-11-24 DIAGNOSIS — Z1231 Encounter for screening mammogram for malignant neoplasm of breast: Secondary | ICD-10-CM

## 2019-11-25 NOTE — Telephone Encounter (Signed)
Noted  

## 2019-12-01 ENCOUNTER — Telehealth: Payer: Self-pay

## 2019-12-01 NOTE — Telephone Encounter (Signed)
Submitted VOB for Monovisc, right knee. 

## 2019-12-08 ENCOUNTER — Telehealth: Payer: Self-pay

## 2019-12-08 NOTE — Telephone Encounter (Signed)
PA required for Monovisc, right knee. Faxed completed PA form to [employer] at [phone]. 

## 2019-12-13 ENCOUNTER — Telehealth: Payer: Self-pay

## 2019-12-13 ENCOUNTER — Ambulatory Visit: Payer: 59 | Admitting: Orthopaedic Surgery

## 2019-12-13 NOTE — Telephone Encounter (Signed)
Talked with Elle with [employer] to check status of PA for Monovisc, right knee.  Per [place], PA is still pending.  Will call patient to schedule once PA has been approved.

## 2019-12-13 NOTE — Telephone Encounter (Signed)
Can you please call patient to R/S her appointment for today?  PA for Monovisc is still pending.  Please advise.  Thank you.

## 2019-12-14 ENCOUNTER — Telehealth: Payer: Self-pay | Admitting: Orthopaedic Surgery

## 2019-12-14 NOTE — Telephone Encounter (Signed)
See what you think needs to be ordered for her from [employer]. Thanks

## 2019-12-14 NOTE — Telephone Encounter (Signed)
Pt called in wanting to let dr.blackman know she would like to proceed with getting the shoe insert for her left foot, since her pain is so severe and she is still waiting for her injection to get approved. Please give her a call.    [phone]

## 2019-12-14 NOTE — Telephone Encounter (Signed)
Place rx on your desk

## 2019-12-15 NOTE — Telephone Encounter (Signed)
Patient aware I will send script to [employer] and they will call her

## 2019-12-16 ENCOUNTER — Telehealth: Payer: Self-pay

## 2019-12-16 NOTE — Telephone Encounter (Signed)
Received VM from [facility] with [facility] stating that clinical notes needed to be faxed to [phone]. Faxed clinical notes to [phone] for Monovisc, right knee.

## 2019-12-21 ENCOUNTER — Telehealth: Payer: Self-pay

## 2019-12-21 NOTE — Telephone Encounter (Signed)
Advised. Made appointment for steroid inj.

## 2019-12-21 NOTE — Telephone Encounter (Signed)
PA was denied for Monovisc, right knee.  Patient's insurance, [employer] does not cover gel injections.  Please advise on next option for patient.  Thank you.

## 2019-12-21 NOTE — Telephone Encounter (Signed)
Please advise 

## 2019-12-21 NOTE — Telephone Encounter (Signed)
There is not really any good options other than repeat steroid injection in 3 months as well as trying alternating tylenol arthritis with Aleve as well as trying Voltaren Gel.

## 2019-12-27 ENCOUNTER — Ambulatory Visit (INDEPENDENT_AMBULATORY_CARE_PROVIDER_SITE_OTHER): Payer: 59 | Admitting: Physician Assistant

## 2019-12-27 ENCOUNTER — Encounter: Payer: Self-pay | Admitting: Physician Assistant

## 2019-12-27 ENCOUNTER — Other Ambulatory Visit: Payer: Self-pay

## 2019-12-27 DIAGNOSIS — M25561 Pain in right knee: Secondary | ICD-10-CM | POA: Diagnosis not present

## 2019-12-27 DIAGNOSIS — G8929 Other chronic pain: Secondary | ICD-10-CM | POA: Diagnosis not present

## 2019-12-27 NOTE — Progress Notes (Signed)
Office Visit Note   Patient: Breanna Phillips           Date of Birth: 05-05-1956           MRN: [id number] Visit Date: 12/27/2019              Requested by: Clovis Riley, L.August Saucer, MD [address] [place],  [place] [address] PCP: Clovis Riley, L.August Saucer, MD   Assessment & Plan: Visit Diagnoses:  1. Chronic pain of right knee     Plan: She will work on Dance movement psychotherapist.  Understands to wait at least 3 months between knee injections.  Follow-up on as-needed basis.  Follow-Up Instructions: No follow-ups on file.   Orders:  No orders of the defined types were placed in this encounter.  No orders of the defined types were placed in this encounter.     Procedures: No procedures performed   Clinical Data: No additional findings.   Subjective: Chief Complaint  Patient presents with  . Right Knee - Injections    HPI Breanna Phillips returns today follow-up of her right knee.  Unfortunately she was not approved for supplemental injection in the knee.  She is asking for a cortisone injection in the knee today.  7 pain mostly medial aspect of the right knee.  Has pain when going up and down steps.  Note some swelling at times in the knee.  In regards to her leg length discrepancy she has an appointment with [facility] to have a lift made for her shoes.  Review of Systems Negative for fevers chills shortness of breath chest pain  Objective: Vital Signs: There were no vitals taken for this visit.  Physical Exam Constitutional:      Appearance: She is not ill-appearing or diaphoretic.  Pulmonary:     Effort: Pulmonary effort is normal.  Neurological:     Mental Status: She is alert and oriented to person, place, and time.  Psychiatric:        Mood and Affect: Mood normal.     Ortho Exam Bilateral knees no effusion abnormal warmth or erythema.  Good range of motion of bilateral knees without pain.  Tenderness along medial joint line of the right knee.  No instability  valgus varus stressing of either knee.  Specialty Comments:  No specialty comments available.  Imaging: No results found.   PMFS History: Patient Active Problem List   Diagnosis Date Noted  . Acute appendicitis 03/01/2014  . Appendicitis 03/01/2014  . Diverticulosis of colon (without mention of hemorrhage) 10/31/2011  . Abdominal pain 10/31/2011  . GERD (gastroesophageal reflux disease) 10/31/2011  . Family history of malignant neoplasm of gastrointestinal tract 10/31/2011  . Raynaud phenomenon 10/31/2011  . Lactose intolerance 10/31/2011  . RAYNAUD'S SYNDROME 01/11/2009  . INTERNAL HEMORRHOIDS 01/11/2009  . GERD 01/11/2009  . CONSTIPATION, CHRONIC 01/11/2009  . IBS 01/11/2009  . RECTAL BLEEDING 01/11/2009  . COLONIC POLYPS, HYPERPLASTIC, HX OF 01/11/2009   Past Medical History:  Diagnosis Date  . Depression    1st episode PPD 1994  . Diverticulosis of colon (without mention of hemorrhage)   . Family history of malignant neoplasm of gastrointestinal tract   . Hemorrhage of rectum and anus   . Hypertension   . Internal hemorrhoids without mention of complication   . Irritable bowel syndrome   . Other constipation   . Personal history of colonic polyps 12/29/2007   hyperplastic polyp  . Raynaud's syndrome     Family History  Problem  Relation Age of Onset  . Heart disease Unknown   . Colon cancer Mother   . Depression Mother   . Esophageal cancer Neg Hx   . Stomach cancer Neg Hx   . Rectal cancer Neg Hx   . Alcohol abuse Father     Past Surgical History:  Procedure Laterality Date  . ABDOMINAL HYSTERECTOMY  2009  . APPENDECTOMY  03/01/2014  . CESAREAN SECTION  1996  . COLONOSCOPY    . FEMUR FRACTURE SURGERY Right ~ 1975  . LAPAROSCOPIC APPENDECTOMY N/A 03/01/2014   Procedure: APPENDECTOMY LAPAROSCOPIC;  Surgeon: Harl Bowie, MD;  Location: [facility];  Service: General;  Laterality: N/A;  . POLYPECTOMY    . TONSILLECTOMY  1977  . TUBAL LIGATION  ~ 2002    Social History   Occupational History  . Occupation: self employed  . Occupation: Radiation protection practitioner    Employer: [employer]  Tobacco Use  . Smoking status: Current Every Day Smoker    Packs/day: 0.25    Years: 25.00    Pack years: 6.25  . Smokeless tobacco: Never Used  Substance and Sexual Activity  . Alcohol use: Yes    Alcohol/week: 21.0 standard drinks    Types: 21 Cans of beer per week    Comment: 03/01/2014 "3 beers qd"  . Drug use: No  . Sexual activity: Yes

## 2020-01-03 ENCOUNTER — Ambulatory Visit
Admission: RE | Admit: 2020-01-03 | Discharge: 2020-01-03 | Disposition: A | Discharge status: Home / Self Care | Payer: 59 | Source: Ambulatory Visit | Attending: Obstetrics & Gynecology | Admitting: Obstetrics & Gynecology

## 2020-01-03 ENCOUNTER — Other Ambulatory Visit: Payer: Self-pay

## 2020-01-03 DIAGNOSIS — Z1231 Encounter for screening mammogram for malignant neoplasm of breast: Secondary | ICD-10-CM

## 2020-01-26 ENCOUNTER — Encounter: Payer: Self-pay | Admitting: Radiology

## 2020-01-26 NOTE — Progress Notes (Signed)
I have resubmitted online Monovisc portal for medial and pharmacy benefits for monovisc.  If denied by both, [employer] might be able to help with costs for patient.  

## 2020-02-12 ENCOUNTER — Ambulatory Visit: Payer: 59

## 2020-02-22 ENCOUNTER — Telehealth: Payer: Self-pay

## 2020-02-22 NOTE — Telephone Encounter (Signed)
Outbound Call Posted by: [facility]  Case Closed : Triaged to Patient Direct  Called [facility] [phone] spoke to [facility], she stated the medication is ready to ship to the [facility], pending patient consent. Estimated co-pay $770.00. Clinic: Please have the patient call [facility] at [phone] if they want to move forward with fulfillment.

## 2020-02-22 NOTE — Telephone Encounter (Signed)
LVM for pt to call back to inform 

## 2020-02-23 ENCOUNTER — Telehealth: Payer: Self-pay | Admitting: Orthopaedic Surgery

## 2020-02-23 NOTE — Telephone Encounter (Signed)
I spoke with the pt and she stated she isnt interested in this at this time.

## 2020-02-23 NOTE — Telephone Encounter (Signed)
Patient called.   She was returning a call you made to her earlier regarding her injection.   Call back: [phone]

## 2020-02-23 NOTE — Telephone Encounter (Signed)
Called see other note 

## 2020-02-25 ENCOUNTER — Ambulatory Visit: Payer: 59 | Attending: Internal Medicine

## 2020-02-25 DIAGNOSIS — Z23 Encounter for immunization: Secondary | ICD-10-CM

## 2020-02-25 NOTE — Progress Notes (Signed)
   Covid-19 Vaccination Clinic  Name:  Breanna Phillips    MRN: [id number] DOB: 1956-08-29  02/25/2020  Breanna Phillips was observed post Covid-19 immunization for 15 minutes without incident. She was provided with Vaccine Information Sheet and instruction to access the V-Safe system.   Breanna Phillips was instructed to call 911 with any severe reactions post vaccine: Marland Kitchen Difficulty breathing  . Swelling of face and throat  . A fast heartbeat  . A bad rash all over body  . Dizziness and weakness   Immunizations Administered    Name Date Dose VIS Date Route   Pfizer COVID-19 Vaccine 02/25/2020  4:31 PM 0.3 mL 10/22/2019 Intramuscular   Manufacturer: [employer], [employer]   Lot: [id number]   NDC: [address]-1

## 2020-02-28 ENCOUNTER — Telehealth: Payer: Self-pay | Admitting: Orthopaedic Surgery

## 2020-02-28 NOTE — Telephone Encounter (Signed)
FYI   Received call from Fayrene Fearing -tech with [employer] note from [place] to him concerning patient stating not interested in the monovisc injection at this time.

## 2020-03-02 ENCOUNTER — Telehealth: Payer: Self-pay | Admitting: Orthopaedic Surgery

## 2020-03-02 NOTE — Telephone Encounter (Signed)
Called pt back. She had more questions about [facility]. These were answered for her.

## 2020-03-02 NOTE — Telephone Encounter (Signed)
Patient called  She is requesting a call back to raise some questions that she has about the injection.   Call back: [phone]

## 2020-03-20 ENCOUNTER — Ambulatory Visit: Payer: Self-pay

## 2020-03-20 ENCOUNTER — Ambulatory Visit: Payer: 59 | Attending: Internal Medicine

## 2020-03-20 DIAGNOSIS — Z23 Encounter for immunization: Secondary | ICD-10-CM

## 2020-03-20 NOTE — Progress Notes (Signed)
   Covid-19 Vaccination Clinic  Name:  Breanna Phillips    MRN: [id number] DOB: 08/08/1956  03/20/2020  Ms. [name] was observed post Covid-19 immunization for 15 minutes without incident. She was provided with Vaccine Information Sheet and instruction to access the V-Safe system.   Ms. [name] was instructed to call 911 with any severe reactions post vaccine: Marland Kitchen Difficulty breathing  . Swelling of face and throat  . A fast heartbeat  . A bad rash all over body  . Dizziness and weakness   Immunizations Administered    Name Date Dose VIS Date Route   Pfizer COVID-19 Vaccine 03/20/2020 10:43 AM 0.3 mL 01/05/2019 Intramuscular   Manufacturer: [employer], [employer]   Lot: [id number]   NDC: [address]-2

## 2020-04-27 ENCOUNTER — Telehealth: Payer: Self-pay | Admitting: Orthopaedic Surgery

## 2020-04-27 NOTE — Telephone Encounter (Signed)
Can we order her gel injection

## 2020-04-27 NOTE — Telephone Encounter (Signed)
Noted  

## 2020-04-27 NOTE — Telephone Encounter (Signed)
Pt called stating she got a cortisone inj in 12/2019 and was offered the gel injection and now the pt is interested in doing the gel shot and would like to know what she needs to do to start the process?  [phone]

## 2020-05-01 ENCOUNTER — Telehealth: Payer: Self-pay

## 2020-05-01 NOTE — Telephone Encounter (Signed)
Submitted VOB for SynviscOne, right knee to see if patient will be approved.

## 2020-05-04 ENCOUNTER — Telehealth: Payer: Self-pay

## 2020-05-04 NOTE — Telephone Encounter (Signed)
PA required for SynviscOne, right knee. Faxed completed PA form to [employer] [facility] at [phone]. 

## 2020-05-04 NOTE — Telephone Encounter (Signed)
Called and left a VM advising Crickett that she is approved for gel injection, but PA is required through her insurance.  Once PA is approved, I will call patient to schedule.

## 2020-05-05 ENCOUNTER — Telehealth: Payer: Self-pay | Admitting: Orthopaedic Surgery

## 2020-05-05 NOTE — Telephone Encounter (Signed)
Patient called requesting a call back from Dr Eliberto Ivory nurse. Patient stated she wanted to know status of injection approval. Patient phone number is [phone]

## 2020-05-05 NOTE — Telephone Encounter (Signed)
FYI  Patient called to follow up, I advised per notes looks like PA was still required by insurance and that the form was filled out and faxed yesterday.  Advised patient once April receives PA, she will contact patient to schedule.

## 2020-05-05 NOTE — Telephone Encounter (Signed)
Noted! Thank you

## 2020-05-09 ENCOUNTER — Telehealth: Payer: Self-pay

## 2020-05-09 NOTE — Telephone Encounter (Addendum)
PA for SynviscOne, right knee has been denied through patient's insurance [employer].   Talked with patient and advised her that PA was denied for SynivscOne, right knee.  Patient voiced that she understands and would like to receive another Cortisone injection for her right knee.  Stated that she would let us know after receiving that if she would like to proceed to get the gel injection through [facility].  Appointment scheduled for cortisone injection.

## 2020-05-22 ENCOUNTER — Other Ambulatory Visit: Payer: Self-pay

## 2020-05-22 ENCOUNTER — Ambulatory Visit (INDEPENDENT_AMBULATORY_CARE_PROVIDER_SITE_OTHER): Payer: 59 | Admitting: Physician Assistant

## 2020-05-22 ENCOUNTER — Encounter: Payer: Self-pay | Admitting: Physician Assistant

## 2020-05-22 DIAGNOSIS — M25561 Pain in right knee: Secondary | ICD-10-CM | POA: Diagnosis not present

## 2020-05-22 DIAGNOSIS — G8929 Other chronic pain: Secondary | ICD-10-CM

## 2020-05-22 MED ORDER — LIDOCAINE HCL 1 % IJ SOLN
3.0000 mL | INTRAMUSCULAR | Status: AC | PRN
  Administered 2020-05-22: 3 mL

## 2020-05-22 MED ORDER — METHYLPREDNISOLONE ACETATE 40 MG/ML IJ SUSP
40.0000 mg | INTRAMUSCULAR | Status: AC | PRN
Start: 2020-05-22 — End: 2020-05-22
  Administered 2020-05-22: 40 mg via INTRA_ARTICULAR

## 2020-05-22 NOTE — Progress Notes (Signed)
   Procedure Note  Patient: Breanna Phillips             Date of Birth: 1956/04/05           MRN: [id number]             Visit Date: 05/22/2020 HPI: Breanna Phillips is well-known to our department service comes in today with right knee pain.  We last saw her in February of this year and provided a cortisone injection in the right knee.  She states she did well until approximately a month ago.  She had no new injury to the knee.  She states she is having pain with exercise and also going up or down stairs.  He notes no significant swelling in the knee.  Patient has chronic right knee pain but overall well-preserved knee on radiographs.  History of distal femoral shaft fracture right knee resulting in significant bowing of the femur.  Physical exam: Right knee full extension full flexion.  No instability valgus varus stressing.  No abnormal warmth erythema or effusion right knee.  Procedures: Visit Diagnoses:  1. Chronic pain of right knee     Large Joint Inj: R knee on 05/22/2020 11:15 AM Indications: pain Details: 22 G 1.5 in needle, anterolateral approach  Arthrogram: No  Medications: 3 mL lidocaine 1 %; 40 mg methylPREDNISolone acetate 40 MG/ML Outcome: tolerated well, no immediate complications Procedure, treatment alternatives, risks and benefits explained, specific risks discussed. Consent was given by the patient. Immediately prior to procedure a time out was called to verify the correct patient, procedure, equipment, support staff and site/side marked as required. Patient was prepped and draped in the usual sterile fashion.     Plan: She will follow up with [place] as needed.  She knows to wait at least 3 months between cortisone injections.  Questions were encouraged and answered at length.  She did ask about supplemental injections however in the past she has been denied approval for these.  I did discuss with her continue with the cortisone injections as she is getting more than 3 months  relief with these.

## 2020-08-28 ENCOUNTER — Encounter: Payer: Self-pay | Admitting: Physician Assistant

## 2020-08-28 ENCOUNTER — Ambulatory Visit (INDEPENDENT_AMBULATORY_CARE_PROVIDER_SITE_OTHER): Payer: 59 | Admitting: Physician Assistant

## 2020-08-28 DIAGNOSIS — M25561 Pain in right knee: Secondary | ICD-10-CM

## 2020-08-28 DIAGNOSIS — G8929 Other chronic pain: Secondary | ICD-10-CM | POA: Diagnosis not present

## 2020-08-28 MED ORDER — METHYLPREDNISOLONE ACETATE 40 MG/ML IJ SUSP
40.0000 mg | INTRAMUSCULAR | Status: AC | PRN
  Administered 2020-08-28: 40 mg via INTRA_ARTICULAR

## 2020-08-28 MED ORDER — LIDOCAINE HCL 1 % IJ SOLN
3.0000 mL | INTRAMUSCULAR | Status: AC | PRN
  Administered 2020-08-28: 3 mL

## 2020-08-28 NOTE — Progress Notes (Signed)
   Procedure Note  Patient: Breanna Phillips             Date of Birth: 1956/06/22           MRN: [id number]             Visit Date: 08/28/2020  HPI: [name] returns today requesting a right knee injection.  Last injection was 08/17/2020.  She states the injection did not last as long as the first injection she had gotten.  She is having no mechanical symptoms in the knee.  She said no fevers chills.  No change in overall medical health.  Physical exam: Right knee no abnormal warmth erythema or effusion.  No instability valgus varus stressing.  Full extension full flexion of the knee.  Tenderness over the anterior medial joint line.  Procedures: Visit Diagnoses:  1. Chronic pain of right knee     Large Joint Inj: R knee on 08/28/2020 10:59 AM Indications: pain Details: 22 G 1.5 in needle, anterolateral approach  Arthrogram: No  Medications: 3 mL lidocaine 1 %; 40 mg methylPREDNISolone acetate 40 MG/ML Outcome: tolerated well, no immediate complications Procedure, treatment alternatives, risks and benefits explained, specific risks discussed. Consent was given by the patient. Immediately prior to procedure a time out was called to verify the correct patient, procedure, equipment, support staff and site/side marked as required. Patient was prepped and draped in the usual sterile fashion.     Plan: She will continue work on Dance movement psychotherapist.  Given the fact that she has a history of a distal femoral shaft fracture that resulted in significant bowing of her femur and the fact that she has plain films that showed no significant arthritic changes in the would recommend MRI to evaluate the knee cartilage to consider possible supplemental injections in the future.  Questions encouraged and answered at length.

## 2020-09-21 ENCOUNTER — Other Ambulatory Visit: Payer: Self-pay | Admitting: Physician Assistant

## 2020-09-21 ENCOUNTER — Other Ambulatory Visit: Payer: Self-pay

## 2020-09-21 DIAGNOSIS — G8929 Other chronic pain: Secondary | ICD-10-CM

## 2020-09-21 DIAGNOSIS — M25561 Pain in right knee: Secondary | ICD-10-CM

## 2020-09-21 MED ORDER — DIAZEPAM 5 MG PO TABS: ORAL_TABLET | ORAL | 0 refills | Status: DC

## 2020-09-21 MED ORDER — DIAZEPAM 5 MG PO TABS: ORAL_TABLET | ORAL | 0 refills | Status: AC

## 2020-10-16 ENCOUNTER — Other Ambulatory Visit: Payer: Self-pay

## 2020-10-16 ENCOUNTER — Ambulatory Visit
Admission: RE | Admit: 2020-10-16 | Discharge: 2020-10-16 | Disposition: A | Discharge status: Home / Self Care | Payer: 59 | Source: Ambulatory Visit | Attending: Orthopaedic Surgery | Admitting: Orthopaedic Surgery

## 2020-10-16 DIAGNOSIS — G8929 Other chronic pain: Secondary | ICD-10-CM

## 2020-10-23 ENCOUNTER — Encounter: Payer: Self-pay | Admitting: Orthopaedic Surgery

## 2020-10-23 ENCOUNTER — Ambulatory Visit (INDEPENDENT_AMBULATORY_CARE_PROVIDER_SITE_OTHER): Payer: 59 | Admitting: Orthopaedic Surgery

## 2020-10-23 DIAGNOSIS — M23611 Other spontaneous disruption of anterior cruciate ligament of right knee: Secondary | ICD-10-CM

## 2020-10-23 DIAGNOSIS — M23321 Other meniscus derangements, posterior horn of medial meniscus, right knee: Secondary | ICD-10-CM

## 2020-10-23 DIAGNOSIS — S83511A Sprain of anterior cruciate ligament of right knee, initial encounter: Secondary | ICD-10-CM

## 2020-10-23 DIAGNOSIS — G8929 Other chronic pain: Secondary | ICD-10-CM | POA: Diagnosis not present

## 2020-10-23 DIAGNOSIS — M25561 Pain in right knee: Secondary | ICD-10-CM | POA: Diagnosis not present

## 2020-10-23 NOTE — Progress Notes (Signed)
The patient comes in today to go over the MRI of her right knee.  She has been having some chronic pain in that right knee.  One of our colleagues in town place steroid injections in the knee in 2018.  It has been slowly getting worse in terms of some of the pain in her knee along the medial aspect.  She denies any significant instability symptoms.  She did step awkwardly into a hole last year and had a twisting injury to that knee and felt a pop.  She does not remember if it swelled a whole lot.  She does have a remote history of a right femur fracture is a 64 year old that required traction.  She says her biggest issue is going up and down stairs.  She is a very young appearing 64 year old female who is very active and this then.  Examination of both knees shows some slight ligamentous laxity with more on her right knee than the left.  Most of her tenderness seems to be along the medial joint line as well.  There is no effusion today with good range of motion of that right knee.  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  There is a medial meniscal tear as well.  There is thinning of the articular cartilage on the medial compartment of the knee but no full-thickness cartilage defect.  I have recommended a referral to my partner Dr. Dorene Grebe so he can evaluate her knee.  She is not interested in any type of surgical intervention in terms of a knee replacement but would be highly interested in some type of arthroscopic intervention and potentially even an ACL reconstruction if this would buy her more time with her knee.  I went over the MRI findings with her and showed her knee model.  She agrees with this assessment and plan as well as referral.

## 2020-10-30 ENCOUNTER — Other Ambulatory Visit: Payer: Self-pay

## 2020-10-30 ENCOUNTER — Telehealth: Payer: Self-pay

## 2020-10-30 ENCOUNTER — Ambulatory Visit (INDEPENDENT_AMBULATORY_CARE_PROVIDER_SITE_OTHER): Payer: 59 | Admitting: Orthopedic Surgery

## 2020-10-30 VITALS — Ht 61.5 in | Wt 129.8 lb

## 2020-10-30 DIAGNOSIS — M25561 Pain in right knee: Secondary | ICD-10-CM | POA: Diagnosis not present

## 2020-10-30 DIAGNOSIS — G8929 Other chronic pain: Secondary | ICD-10-CM | POA: Diagnosis not present

## 2020-10-30 DIAGNOSIS — S83511A Sprain of anterior cruciate ligament of right knee, initial encounter: Secondary | ICD-10-CM

## 2020-10-30 NOTE — Telephone Encounter (Signed)
Noted  

## 2020-10-30 NOTE — Telephone Encounter (Signed)
Right knee gel injection  

## 2020-11-02 ENCOUNTER — Ambulatory Visit (INDEPENDENT_AMBULATORY_CARE_PROVIDER_SITE_OTHER): Payer: 59 | Admitting: Rehabilitative and Restorative Service Providers"

## 2020-11-02 ENCOUNTER — Encounter: Payer: Self-pay | Admitting: Rehabilitative and Restorative Service Providers"

## 2020-11-02 ENCOUNTER — Other Ambulatory Visit: Payer: Self-pay

## 2020-11-02 DIAGNOSIS — R600 Localized edema: Secondary | ICD-10-CM

## 2020-11-02 DIAGNOSIS — M25561 Pain in right knee: Secondary | ICD-10-CM

## 2020-11-02 DIAGNOSIS — G8929 Other chronic pain: Secondary | ICD-10-CM

## 2020-11-02 DIAGNOSIS — M6281 Muscle weakness (generalized): Secondary | ICD-10-CM | POA: Diagnosis not present

## 2020-11-02 DIAGNOSIS — M25661 Stiffness of right knee, not elsewhere classified: Secondary | ICD-10-CM

## 2020-11-02 DIAGNOSIS — R262 Difficulty in walking, not elsewhere classified: Secondary | ICD-10-CM | POA: Diagnosis not present
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
## 2020-11-02 NOTE — Patient Instructions (Signed)
Access Code: [email or web address] URL: [email or web address] Date: 11/02/2020 Prepared by: Pauletta Browns  Exercises Tandem Stance - 2 x daily - 7 x weekly - 1 sets - 5 reps - 20 second hold Small Range Straight Leg Raise - 1-2 x daily - 7 x weekly - 4-10 sets - 5 reps - 3 seconds hold Prone Hip Extension - 1 x daily - 7 x weekly - 2 sets - 10 reps - 3 seconds hold

## 2020-11-05 ENCOUNTER — Encounter: Payer: Self-pay | Admitting: Orthopedic Surgery
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
## 2020-11-08 ENCOUNTER — Encounter: Payer: Self-pay | Admitting: Rehabilitative and Restorative Service Providers"

## 2020-11-08 ENCOUNTER — Other Ambulatory Visit: Payer: Self-pay

## 2020-11-08 ENCOUNTER — Ambulatory Visit (INDEPENDENT_AMBULATORY_CARE_PROVIDER_SITE_OTHER): Payer: 59 | Admitting: Rehabilitative and Restorative Service Providers"

## 2020-11-08 DIAGNOSIS — M25661 Stiffness of right knee, not elsewhere classified: Secondary | ICD-10-CM | POA: Diagnosis not present

## 2020-11-08 DIAGNOSIS — R600 Localized edema: Secondary | ICD-10-CM

## 2020-11-08 DIAGNOSIS — R262 Difficulty in walking, not elsewhere classified: Secondary | ICD-10-CM

## 2020-11-08 DIAGNOSIS — G8929 Other chronic pain: Secondary | ICD-10-CM

## 2020-11-08 DIAGNOSIS — M25561 Pain in right knee: Secondary | ICD-10-CM

## 2020-11-08 DIAGNOSIS — M6281 Muscle weakness (generalized): Secondary | ICD-10-CM

## 2020-11-08 NOTE — Therapy (Signed)
[facility] Physical Therapy [address] [place], [place], [address] Phone: [phone]   Fax:  [phone]  Physical Therapy Treatment  Patient Details  Name: Breanna Phillips MRN: [id number] Date of Birth: 13-Jul-1956 Referring Provider (PT): Cammy Copa MD   Encounter Date: 11/08/2020   PT End of Session - 11/08/20 1730    Visit Number 2    Number of Visits 16    PT Start Time 1515    PT Stop Time 1558    PT Time Calculation (min) 43 min    Activity Tolerance Patient tolerated treatment well;No increased pain    Behavior During Therapy WFL for tasks assessed/performed           Past Medical History:  Diagnosis Date  . Depression    1st episode PPD 1994  . Diverticulosis of colon (without mention of hemorrhage)   . Family history of malignant neoplasm of gastrointestinal tract   . Hemorrhage of rectum and anus   . Hypertension   . Internal hemorrhoids without mention of complication   . Irritable bowel syndrome   . Other constipation   . Personal history of colonic polyps 12/29/2007   hyperplastic polyp  . Raynaud's syndrome     Past Surgical History:  Procedure Laterality Date  . ABDOMINAL HYSTERECTOMY  2009  . APPENDECTOMY  03/01/2014  . CESAREAN SECTION  1996  . COLONOSCOPY    . FEMUR FRACTURE SURGERY Right ~ 1975  . LAPAROSCOPIC APPENDECTOMY N/A 03/01/2014   Procedure: APPENDECTOMY LAPAROSCOPIC;  Surgeon: Shelly Rubenstein, MD;  Location: [facility] OR;  Service: General;  Laterality: N/A;  . POLYPECTOMY    . TONSILLECTOMY  1977  . TUBAL LIGATION  ~ 2002    There were no vitals filed for this visit.   Subjective Assessment - 11/08/20 1727    Subjective Breanna Phillips reports soreness today.  Possibly due to new activities and changes in the weather.    How long can you sit comfortably? Minimal stiffness    How long can you stand comfortably? Soreness    How long can you walk comfortably? Soreness    Patient Stated Goals Get stronger.     Currently in Pain? Yes    Pain Score 4     Pain Location Knee    Pain Orientation Right    Pain Descriptors / Indicators Aching;Sore    Pain Type Chronic pain    Pain Onset More than a month ago    Pain Frequency Intermittent    Aggravating Factors  Too much weight-bearing    Pain Relieving Factors Rest and ice    Effect of Pain on Daily Activities Limited WB endurance, exercise participation and difficulty descending stairs    Multiple Pain Sites No                             [facility] Adult PT Treatment/Exercise - 11/08/20 0001      Neuro Re-ed    Neuro Re-ed Details  Tandem balance 4X each eyes open/eyes open head turning 20 seconds      Exercises   Exercises Knee/Hip      Knee/Hip Exercises: Machines for Strengthening   Cybex Knee Extension Double leg up and R only eccentrics 15# 10X    Cybex Knee Flexion Double leg pull and single leg return 15# 10X    Total Gym Leg Press Double leg 87# & single leg (R) 50# 10X slow eccentrics do NOT  lock knee      Knee/Hip Exercises: Seated   Long Arc Quad Strengthening;Both;3 sets;5 reps   Seated straight leg raises   Other Seated Knee/Hip Exercises Hamstrings Isometrics 10X 5 seconds      Knee/Hip Exercises: Prone   Hip Extension Strengthening;Both;1 set;10 reps   Prone alternating hip extensions                 PT Education - 11/08/20 1729    Education Details Reviewed HEP and progressed current program.    Person(s) Educated Patient    Methods Explanation;Demonstration;Tactile cues;Verbal cues    Comprehension Returned demonstration;Need further instruction;Verbal cues required;Verbalized understanding;Tactile cues required               PT Long Term Goals - 11/08/20 1729      PT LONG TERM GOAL #1   Title Improve FOTO to 60.    Baseline 38    Time 8    Period Weeks    Status On-going      PT LONG TERM GOAL #2   Title Improve B quadriceps and hamstrings strength to 4+/5 or better (MMT).     Baseline See objective    Time 8    Period Weeks    Status On-going      PT LONG TERM GOAL #3   Title Breanna Phillips will be independent and compliant with her long-term HEP at DC.    Time 8    Period Weeks    Status On-going                 Plan - 11/08/20 1554    Clinical Impression Statement Breanna Phillips has a very unstable and weak R knee.  Strength was progressed today and modifications to her hamstrings strengthening due to some discomfor with prone leg extensions.  Quadriceps and hamstrings strengthening remains a high priority along with proprioception to improve Breanna Phillips's weight-bearing function.    Examination-Activity Limitations Bend;Squat;Locomotion Level;Stairs    Examination-Participation Restrictions Community Activity    Stability/Clinical Decision Making Stable/Uncomplicated    Rehab Potential Good    PT Frequency 2x / week    PT Duration 8 weeks   1-2X/week for a few weeks to get comfortable with her home and gym program then RA at 8-12 weeks.   PT Treatment/Interventions ADLs/Self Care Home Management;Cryotherapy;Therapeutic activities;Stair training;Gait training;Therapeutic exercise;Balance training;Neuromuscular re-education;Patient/family education;Vasopneumatic Device    PT Next Visit Plan Increase strength challenges (quadriceps and secondarily hamstrings)    PT Home Exercise Plan Access Code: [email or web address]    Consulted and Agree with Plan of Care Patient           Patient will benefit from skilled therapeutic intervention in order to improve the following deficits and impairments:  Decreased balance,Decreased coordination,Decreased endurance,Decreased range of motion,Decreased strength,Difficulty walking,Increased edema,Pain  Visit Diagnosis: Difficulty walking  Localized edema  Muscle weakness (generalized)  Stiffness of right knee, not elsewhere classified  Chronic pain of right knee     Problem List Patient Active Problem List   Diagnosis Date Noted  .  Acute appendicitis 03/01/2014  . Appendicitis 03/01/2014  . Diverticulosis of colon (without mention of hemorrhage) 10/31/2011  . Abdominal pain 10/31/2011  . GERD (gastroesophageal reflux disease) 10/31/2011  . Family history of malignant neoplasm of gastrointestinal tract 10/31/2011  . Raynaud phenomenon 10/31/2011  . Lactose intolerance 10/31/2011  . RAYNAUD'S SYNDROME 01/11/2009  . INTERNAL HEMORRHOIDS 01/11/2009  . GERD 01/11/2009  . CONSTIPATION, CHRONIC 01/11/2009  . IBS 01/11/2009  . RECTAL  BLEEDING 01/11/2009  . COLONIC POLYPS, HYPERPLASTIC, HX OF 01/11/2009    Cherlyn Cushing PT, MPT 11/08/2020, 5:32 PM  [facility] Physical Therapy [address] [place], [place], [address] Phone: [phone]   Fax:  [phone]  Name: Breanna Phillips MRN: [id number] Date of Birth: Jul 18, 1956

## 2020-11-16 ENCOUNTER — Other Ambulatory Visit: Payer: Self-pay

## 2020-11-16 ENCOUNTER — Ambulatory Visit (INDEPENDENT_AMBULATORY_CARE_PROVIDER_SITE_OTHER): Payer: 59 | Admitting: Rehabilitative and Restorative Service Providers"

## 2020-11-16 ENCOUNTER — Encounter: Payer: Self-pay | Admitting: Rehabilitative and Restorative Service Providers"

## 2020-11-16 DIAGNOSIS — M6281 Muscle weakness (generalized): Secondary | ICD-10-CM

## 2020-11-16 DIAGNOSIS — G8929 Other chronic pain: Secondary | ICD-10-CM

## 2020-11-16 DIAGNOSIS — R600 Localized edema: Secondary | ICD-10-CM

## 2020-11-16 DIAGNOSIS — M25661 Stiffness of right knee, not elsewhere classified: Secondary | ICD-10-CM

## 2020-11-16 DIAGNOSIS — M25561 Pain in right knee: Secondary | ICD-10-CM

## 2020-11-16 DIAGNOSIS — R262 Difficulty in walking, not elsewhere classified: Secondary | ICD-10-CM

## 2020-11-16 NOTE — Therapy (Signed)
[facility] Physical Therapy [address] [place], [place], [address] Phone: [phone]   Fax:  [phone]  Physical Therapy Treatment  Patient Details  Name: Breanna Phillips MRN: [id number] Date of Birth: 1955-12-06 Referring Provider (PT): Cammy Copa MD   Encounter Date: 11/16/2020   PT End of Session - 11/16/20 0824    Visit Number 3    Number of Visits 16    PT Start Time 0803    PT Stop Time 0845    PT Time Calculation (min) 42 min    Activity Tolerance Patient tolerated treatment well;No increased pain    Behavior During Therapy WFL for tasks assessed/performed           Past Medical History:  Diagnosis Date  . Depression    1st episode PPD 1994  . Diverticulosis of colon (without mention of hemorrhage)   . Family history of malignant neoplasm of gastrointestinal tract   . Hemorrhage of rectum and anus   . Hypertension   . Internal hemorrhoids without mention of complication   . Irritable bowel syndrome   . Other constipation   . Personal history of colonic polyps 12/29/2007   hyperplastic polyp  . Raynaud's syndrome     Past Surgical History:  Procedure Laterality Date  . ABDOMINAL HYSTERECTOMY  2009  . APPENDECTOMY  03/01/2014  . CESAREAN SECTION  1996  . COLONOSCOPY    . FEMUR FRACTURE SURGERY Right ~ 1975  . LAPAROSCOPIC APPENDECTOMY N/A 03/01/2014   Procedure: APPENDECTOMY LAPAROSCOPIC;  Surgeon: Shelly Rubenstein, MD;  Location: [facility] OR;  Service: General;  Laterality: N/A;  . POLYPECTOMY    . TONSILLECTOMY  1977  . TUBAL LIGATION  ~ 2002    There were no vitals filed for this visit.   Subjective Assessment - 11/16/20 0817    Subjective Less inferior knee pain as compared to last week.  Balance is challenging but no instability.    How long can you sit comfortably? Minimal stiffness    How long can you stand comfortably? Soreness    How long can you walk comfortably? Soreness    Patient Stated Goals Get stronger.     Currently in Pain? No/denies    Pain Score 0-No pain    Pain Onset More than a month ago    Aggravating Factors  Too much weight-bearing    Pain Relieving Factors Rest    Effect of Pain on Daily Activities Limited WB endurance, stairs are challenging    Multiple Pain Sites No                             [facility] Adult PT Treatment/Exercise - 11/16/20 0001      Neuro Re-ed    Neuro Re-ed Details  Tandem balance eyes open/eyes open head turning/eyes closed 2X 20 seconds each & wobble board 2X 1 minute      Exercises   Exercises Knee/Hip      Knee/Hip Exercises: Machines for Strengthening   Cybex Knee Extension Double leg up and R only eccentrics 15# 10X    Cybex Knee Flexion Double leg pull and single leg return 15# 10X    Total Gym Leg Press Double leg 87# & single leg (R) 50# 15X slow eccentrics do NOT lock knee      Knee/Hip Exercises: Seated   Long Arc Quad Strengthening;Both;3 sets;5 reps   Seated straight leg raises   Other Seated Knee/Hip Exercises  Hamstrings Isometrics 10X 5 seconds                  PT Education - 11/16/20 4696    Education Details Symphony reports good HEP compliance.  Reviewed and made appropriate modifications to her HEP.    Person(s) Educated Patient    Methods Explanation;Demonstration;Verbal cues    Comprehension Verbalized understanding;Returned demonstration;Need further instruction;Verbal cues required               PT Long Term Goals - 11/16/20 0824      PT LONG TERM GOAL #1   Title Improve FOTO to 60.    Baseline 38    Time 8    Period Weeks    Status On-going      PT LONG TERM GOAL #2   Title Improve B quadriceps and hamstrings strength to 4+/5 or better (MMT).    Baseline See objective    Time 8    Period Weeks    Status On-going      PT LONG TERM GOAL #3   Title Chanda will be independent and compliant with her long-term HEP at DC.    Time 8    Period Weeks    Status On-going                  Plan - 11/16/20 0836    Clinical Impression Statement Breanna Phillips notes early progress with her pain and subjective strength.  Stairs are still challenging and inferior knee pain is noted with fatigue.  Continued quadriceps and hamstrings strengthening remains a focus along with proprioceptive work to improve strength, stability, pain and function of the R knee.    Examination-Activity Limitations Bend;Squat;Locomotion Level;Stairs    Examination-Participation Restrictions Community Activity    Stability/Clinical Decision Making Stable/Uncomplicated    Rehab Potential Good    PT Frequency 2x / week    PT Duration 8 weeks   1-2X/week for a few weeks to get comfortable with her home and gym program then RA at 8-12 weeks.   PT Treatment/Interventions ADLs/Self Care Home Management;Cryotherapy;Therapeutic activities;Stair training;Gait training;Therapeutic exercise;Balance training;Neuromuscular re-education;Patient/family education;Vasopneumatic Device    PT Next Visit Plan Increase strength challenges (quadriceps and secondarily hamstrings)    PT Home Exercise Plan Access Code: [email or web address]    Consulted and Agree with Plan of Care Patient           Patient will benefit from skilled therapeutic intervention in order to improve the following deficits and impairments:  Decreased balance,Decreased coordination,Decreased endurance,Decreased range of motion,Decreased strength,Difficulty walking,Increased edema,Pain  Visit Diagnosis: Difficulty walking  Localized edema  Muscle weakness (generalized)  Stiffness of right knee, not elsewhere classified  Chronic pain of right knee     Problem List Patient Active Problem List   Diagnosis Date Noted  . Acute appendicitis 03/01/2014  . Appendicitis 03/01/2014  . Diverticulosis of colon (without mention of hemorrhage) 10/31/2011  . Abdominal pain 10/31/2011  . GERD (gastroesophageal reflux disease) 10/31/2011  . Family history of  malignant neoplasm of gastrointestinal tract 10/31/2011  . Raynaud phenomenon 10/31/2011  . Lactose intolerance 10/31/2011  . RAYNAUD'S SYNDROME 01/11/2009  . INTERNAL HEMORRHOIDS 01/11/2009  . GERD 01/11/2009  . CONSTIPATION, CHRONIC 01/11/2009  . IBS 01/11/2009  . RECTAL BLEEDING 01/11/2009  . COLONIC POLYPS, HYPERPLASTIC, HX OF 01/11/2009    Cherlyn Cushing PT, MPT 11/16/2020, 8:40 AM  [facility] Physical Therapy [address] [place], [place], [address] Phone: [phone]   Fax:  [phone]  Name: Breanna Phillips  Breanna Phillips MRN: [id number] Date of Birth: Mar 24, 1956

## 2020-11-17 ENCOUNTER — Telehealth: Payer: Self-pay | Admitting: Orthopedic Surgery

## 2020-11-17 NOTE — Telephone Encounter (Signed)
PT is wanting to know if she is going to be getting a gel injection. She would really like it if she could. Please call her at [phone]

## 2020-11-17 NOTE — Telephone Encounter (Signed)
Please see last office dictation has this pt been approved for gel injections?

## 2020-11-20 NOTE — Telephone Encounter (Signed)
Talked with patient and advised her that gel injection is not covered under her insurance [employer].  Advised patient of [employer], which is a series of 3 and will cost the patient $250 per knee.  Patient stated that she would like to try this injection and would like to know if she should continue physical therapy while receiving the gel injection? CB# [phone].  Please advise.  Thank you.

## 2020-11-21 NOTE — Telephone Encounter (Signed)
Please advise if this is ok.  

## 2020-11-21 NOTE — Telephone Encounter (Signed)
No pt with injections thx

## 2020-11-22 NOTE — Telephone Encounter (Signed)
I called and talked with patient. She would like to know what other options she has other than gel injections?  She would also like to know that if she proceeds with the gel injections would she still be able to walk the normal 2.5 to 3 miles a day that she currently walks?

## 2020-11-23 ENCOUNTER — Encounter: Payer: Self-pay | Admitting: Rehabilitative and Restorative Service Providers"

## 2020-11-23 ENCOUNTER — Other Ambulatory Visit: Payer: Self-pay

## 2020-11-23 ENCOUNTER — Ambulatory Visit (INDEPENDENT_AMBULATORY_CARE_PROVIDER_SITE_OTHER): Payer: 59 | Admitting: Rehabilitative and Restorative Service Providers"

## 2020-11-23 DIAGNOSIS — G8929 Other chronic pain: Secondary | ICD-10-CM

## 2020-11-23 DIAGNOSIS — M25661 Stiffness of right knee, not elsewhere classified: Secondary | ICD-10-CM | POA: Diagnosis not present

## 2020-11-23 DIAGNOSIS — M25561 Pain in right knee: Secondary | ICD-10-CM

## 2020-11-23 DIAGNOSIS — R262 Difficulty in walking, not elsewhere classified: Secondary | ICD-10-CM

## 2020-11-23 DIAGNOSIS — M6281 Muscle weakness (generalized): Secondary | ICD-10-CM

## 2020-11-23 DIAGNOSIS — R600 Localized edema: Secondary | ICD-10-CM

## 2020-11-23 NOTE — Therapy (Addendum)
[facility] Physical Therapy [address] [place], [place], [address] Phone: [phone]   Fax:  [phone]  Physical Therapy Treatment  Patient Details  Name: Breanna Phillips MRN: [id number] Date of Birth: Sep 29, 1956 Referring Provider (PT): Meredith Pel MD  PHYSICAL THERAPY DISCHARGE SUMMARY  Visits from Start of Care: HEP  Current functional level related to goals / functional outcomes: See Note   Remaining deficits: See note    Education / Equipment: HEP  Plan:                                                    Patient goals were partially met. Patient is being discharged due to not returning since the last visit.  ?????     Encounter Date: 11/23/2020   PT End of Session - 11/23/20 0833    Visit Number 4    Number of Visits 16    PT Start Time 0800    PT Stop Time 0845    PT Time Calculation (min) 45 min    Activity Tolerance Patient tolerated treatment well;No increased pain    Behavior During Therapy WFL for tasks assessed/performed           Past Medical History:  Diagnosis Date  . Depression    1st episode PPD 1994  . Diverticulosis of colon (without mention of hemorrhage)   . Family history of malignant neoplasm of gastrointestinal tract   . Hemorrhage of rectum and anus   . Hypertension   . Internal hemorrhoids without mention of complication   . Irritable bowel syndrome   . Other constipation   . Personal history of colonic polyps 12/29/2007   hyperplastic polyp  . Raynaud's syndrome     Past Surgical History:  Procedure Laterality Date  . ABDOMINAL HYSTERECTOMY  2009  . APPENDECTOMY  03/01/2014  . CESAREAN SECTION  1996  . COLONOSCOPY    . FEMUR FRACTURE SURGERY Right ~ 1975  . LAPAROSCOPIC APPENDECTOMY N/A 03/01/2014   Procedure: APPENDECTOMY LAPAROSCOPIC;  Surgeon: Harl Bowie, MD;  Location: [facility];  Service: General;  Laterality: N/A;  . POLYPECTOMY    . TONSILLECTOMY  1977  . TUBAL LIGATION  ~ 2002     There were no vitals filed for this visit.   Subjective Assessment - 11/23/20 0258    Subjective [name] mentions R knee popping that is a concern.  Reminded her she will get popping although instability and pain should improve.    How long can you sit comfortably? Minimal stiffness    How long can you stand comfortably? Soreness    How long can you walk comfortably? Soreness    Patient Stated Goals Get stronger.    Currently in Pain? No/denies    Pain Score 0-No pain    Pain Location Knee    Pain Orientation Right    Pain Descriptors / Indicators Aching;Sore    Pain Type Chronic pain    Pain Onset More than a month ago    Pain Frequency Intermittent    Aggravating Factors  AM stiffness    Pain Relieving Factors Movement or a hot shower    Effect of Pain on Daily Activities Uses hands with stairs (handrail)    Multiple Pain Sites No  [facility] Adult PT Treatment/Exercise - 11/23/20 0001      Neuro Re-ed    Neuro Re-ed Details  Tandem balance eyes open/eyes open head turning/eyes closed 2X 20 seconds each & wobble board 2X 1 minute      Exercises   Exercises Knee/Hip      Knee/Hip Exercises: Machines for Strengthening   Cybex Knee Extension Double leg up and R only eccentrics 15# 10X    Cybex Knee Flexion Double leg pull and single leg return 15# 10X    Total Gym Leg Press Double leg 100# & single leg (R) 50# 15X slow eccentrics do NOT lock knee      Knee/Hip Exercises: Seated   Long Arc Quad Strengthening;Both;3 sets;5 reps   Seated straight leg raises   Other Seated Knee/Hip Exercises Hamstrings Isometrics 10X 5 seconds                  PT Education - 11/23/20 0830    Education Details Reviewed HEP.  Discussed that knee popping is expected with the amount of degenerative changes she has in her R knee.  Should be more stable, stronger and have less pain as quadriceps and hamstrings strength improves.    Person(s)  Educated Patient    Methods Explanation;Demonstration;Tactile cues;Verbal cues    Comprehension Returned demonstration;Need further instruction;Verbal cues required;Verbalized understanding;Tactile cues required               PT Long Term Goals - 11/23/20 0833      PT LONG TERM GOAL #1   Title Improve FOTO to 60.    Baseline 38    Time 8    Period Weeks    Status On-going      PT LONG TERM GOAL #2   Title Improve B quadriceps and hamstrings strength to 4+/5 or better (MMT).    Baseline See objective    Time 8    Period Weeks    Status On-going      PT LONG TERM GOAL #3   Title Amara will be independent and compliant with her long-term HEP at DC.    Time 8    Period Weeks    Status On-going                 Plan - 11/23/20 0835    Clinical Impression Statement Jase continues to note progress with her pain and strength.  She still requires a handrail with stairs and is concerned about knee popping.  Focus remains on thigh strength, proprioception and function to improve pain, strength and independence with ADLs.    Examination-Activity Limitations Bend;Squat;Locomotion Level;Stairs    Examination-Participation Restrictions Community Activity    Stability/Clinical Decision Making Stable/Uncomplicated    Rehab Potential Good    PT Frequency 2x / week    PT Duration 8 weeks   1-2X/week for a few weeks to get comfortable with her home and gym program then RA at 8-12 weeks.   PT Treatment/Interventions ADLs/Self Care Home Management;Cryotherapy;Therapeutic activities;Stair training;Gait training;Therapeutic exercise;Balance training;Neuromuscular re-education;Patient/family education;Vasopneumatic Device    PT Next Visit Plan Increase strength challenges (quadriceps and secondarily hamstrings)    PT Home Exercise Plan Access Code: [email or web address]    Consulted and Agree with Plan of Care Patient           Patient will benefit from skilled therapeutic intervention in order  to improve the following deficits and impairments:  Decreased balance,Decreased coordination,Decreased endurance,Decreased range of motion,Decreased strength,Difficulty walking,Increased edema,Pain  Visit Diagnosis: Difficulty  walking  Localized edema  Muscle weakness (generalized)  Stiffness of right knee, not elsewhere classified  Chronic pain of right knee     Problem List Patient Active Problem List   Diagnosis Date Noted  . Acute appendicitis 03/01/2014  . Appendicitis 03/01/2014  . Diverticulosis of colon (without mention of hemorrhage) 10/31/2011  . Abdominal pain 10/31/2011  . GERD (gastroesophageal reflux disease) 10/31/2011  . Family history of malignant neoplasm of gastrointestinal tract 10/31/2011  . Raynaud phenomenon 10/31/2011  . Lactose intolerance 10/31/2011  . RAYNAUD'S SYNDROME 01/11/2009  . INTERNAL HEMORRHOIDS 01/11/2009  . GERD 01/11/2009  . CONSTIPATION, CHRONIC 01/11/2009  . IBS 01/11/2009  . RECTAL BLEEDING 01/11/2009  . COLONIC POLYPS, HYPERPLASTIC, HX OF 01/11/2009    Farley Ly PT, MPT 11/23/2020, 8:47 AM  [facility] Physical Therapy [address] [place], [place], [address] Phone: [phone]   Fax:  [phone]  Name: JAZLYNNE MILLINER MRN: [id number] Date of Birth: 07-01-1956

## 2020-11-30 ENCOUNTER — Encounter: Payer: 59 | Admitting: Rehabilitative and Restorative Service Providers"

## 2020-11-30 NOTE — Telephone Encounter (Signed)
No great options other than gel injection.  No guarantees on being able to walk for 3 miles with the gel injections..  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  With the absence of describe symptomatic instability in her knee I think ACL reconstruction is not necessarily clearly indicated but could be considered.  Arthroscopy with meniscal debridement on that medial side could also be considered but that may increase the instability that Breanna Phillips has with an absent ACL if we take out part of the posterior horn of the medial meniscus.  And if Breanna Phillips is adamant about avoiding surgery but other than gel injections I do not think Breanna Phillips has many other options which are noninvasive that have a high chance of success for her current situation

## 2020-12-01 NOTE — Telephone Encounter (Signed)
Should be okay to get cortisone injection on Monday but if she is not having any persistent pain she can hold off on the injection until she is more symptomatic.  As for gel injection, okay to get her approved for this now that she is not doing PT

## 2020-12-01 NOTE — Telephone Encounter (Signed)
Tried calling patient to discuss. No answer. LMVM for her to call us back to discuss.

## 2020-12-04 ENCOUNTER — Ambulatory Visit: Payer: 59 | Admitting: Physician Assistant

## 2020-12-07 ENCOUNTER — Encounter: Payer: 59 | Admitting: Rehabilitative and Restorative Service Providers"

## 2021-08-10 DIAGNOSIS — N951 Menopausal and female climacteric states: Secondary | ICD-10-CM | POA: Diagnosis not present

## 2021-08-10 DIAGNOSIS — F5104 Psychophysiologic insomnia: Secondary | ICD-10-CM | POA: Diagnosis not present

## 2021-08-28 DIAGNOSIS — N951 Menopausal and female climacteric states: Secondary | ICD-10-CM | POA: Diagnosis not present

## 2021-08-28 DIAGNOSIS — E559 Vitamin D deficiency, unspecified: Secondary | ICD-10-CM | POA: Diagnosis not present

## 2021-08-28 DIAGNOSIS — F5104 Psychophysiologic insomnia: Secondary | ICD-10-CM | POA: Diagnosis not present

## 2021-08-28 DIAGNOSIS — Z0001 Encounter for general adult medical examination with abnormal findings: Secondary | ICD-10-CM | POA: Diagnosis not present

## 2021-08-28 DIAGNOSIS — I10 Essential (primary) hypertension: Secondary | ICD-10-CM | POA: Diagnosis not present

## 2021-09-10 DIAGNOSIS — H18832 Recurrent erosion of cornea, left eye: Secondary | ICD-10-CM | POA: Diagnosis not present

## 2021-09-13 DIAGNOSIS — L821 Other seborrheic keratosis: Secondary | ICD-10-CM | POA: Diagnosis not present

## 2021-09-13 DIAGNOSIS — L570 Actinic keratosis: Secondary | ICD-10-CM | POA: Diagnosis not present

## 2021-09-13 DIAGNOSIS — D220 Melanocytic nevi of lip: Secondary | ICD-10-CM | POA: Diagnosis not present

## 2021-09-13 DIAGNOSIS — L720 Epidermal cyst: Secondary | ICD-10-CM | POA: Diagnosis not present

## 2021-09-13 DIAGNOSIS — L820 Inflamed seborrheic keratosis: Secondary | ICD-10-CM | POA: Diagnosis not present

## 2021-09-14 DIAGNOSIS — M94261 Chondromalacia, right knee: Secondary | ICD-10-CM | POA: Diagnosis not present

## 2021-09-14 DIAGNOSIS — Y999 Unspecified external cause status: Secondary | ICD-10-CM | POA: Diagnosis not present

## 2021-09-14 DIAGNOSIS — M948X6 Other specified disorders of cartilage, lower leg: Secondary | ICD-10-CM | POA: Diagnosis not present

## 2021-09-14 DIAGNOSIS — G8918 Other acute postprocedural pain: Secondary | ICD-10-CM | POA: Diagnosis not present

## 2021-09-14 DIAGNOSIS — S83231A Complex tear of medial meniscus, current injury, right knee, initial encounter: Secondary | ICD-10-CM | POA: Diagnosis not present

## 2021-09-14 DIAGNOSIS — M2341 Loose body in knee, right knee: Secondary | ICD-10-CM | POA: Diagnosis not present

## 2021-09-14 DIAGNOSIS — M23231 Derangement of other medial meniscus due to old tear or injury, right knee: Secondary | ICD-10-CM | POA: Diagnosis not present

## 2021-09-14 DIAGNOSIS — X58XXXA Exposure to other specified factors, initial encounter: Secondary | ICD-10-CM | POA: Diagnosis not present

## 2021-09-20 DIAGNOSIS — M25661 Stiffness of right knee, not elsewhere classified: Secondary | ICD-10-CM | POA: Diagnosis not present

## 2021-09-20 DIAGNOSIS — M6281 Muscle weakness (generalized): Secondary | ICD-10-CM | POA: Diagnosis not present

## 2021-10-03 DIAGNOSIS — I10 Essential (primary) hypertension: Secondary | ICD-10-CM | POA: Diagnosis not present

## 2021-10-03 DIAGNOSIS — N951 Menopausal and female climacteric states: Secondary | ICD-10-CM | POA: Diagnosis not present

## 2021-10-03 DIAGNOSIS — R748 Abnormal levels of other serum enzymes: Secondary | ICD-10-CM | POA: Diagnosis not present

## 2021-10-03 DIAGNOSIS — K7581 Nonalcoholic steatohepatitis (NASH): Secondary | ICD-10-CM | POA: Diagnosis not present

## 2021-10-09 DIAGNOSIS — Z1231 Encounter for screening mammogram for malignant neoplasm of breast: Secondary | ICD-10-CM | POA: Diagnosis not present

## 2021-10-17 DIAGNOSIS — Z1211 Encounter for screening for malignant neoplasm of colon: Secondary | ICD-10-CM | POA: Diagnosis not present

## 2021-10-17 DIAGNOSIS — Z800 Family history of malignant neoplasm of digestive organs: Secondary | ICD-10-CM | POA: Diagnosis not present

## 2021-10-19 DIAGNOSIS — H18832 Recurrent erosion of cornea, left eye: Secondary | ICD-10-CM | POA: Diagnosis not present

## 2021-11-30 DIAGNOSIS — L578 Other skin changes due to chronic exposure to nonionizing radiation: Secondary | ICD-10-CM | POA: Diagnosis not present

## 2022-01-22 DIAGNOSIS — E785 Hyperlipidemia, unspecified: Secondary | ICD-10-CM | POA: Diagnosis not present

## 2022-01-22 DIAGNOSIS — K7581 Nonalcoholic steatohepatitis (NASH): Secondary | ICD-10-CM | POA: Diagnosis not present

## 2022-01-22 DIAGNOSIS — I10 Essential (primary) hypertension: Secondary | ICD-10-CM | POA: Diagnosis not present

## 2022-06-07 DIAGNOSIS — M25561 Pain in right knee: Secondary | ICD-10-CM | POA: Diagnosis not present

## 2022-06-17 DIAGNOSIS — Z7689 Persons encountering health services in other specified circumstances: Secondary | ICD-10-CM | POA: Diagnosis not present

## 2022-06-17 DIAGNOSIS — E8941 Symptomatic postprocedural ovarian failure: Secondary | ICD-10-CM | POA: Diagnosis not present

## 2022-06-17 DIAGNOSIS — Z0000 Encounter for general adult medical examination without abnormal findings: Secondary | ICD-10-CM | POA: Diagnosis not present

## 2022-06-17 DIAGNOSIS — I10 Essential (primary) hypertension: Secondary | ICD-10-CM | POA: Diagnosis not present

## 2022-08-29 DIAGNOSIS — M179 Osteoarthritis of knee, unspecified: Secondary | ICD-10-CM | POA: Diagnosis not present

## 2022-08-29 DIAGNOSIS — R232 Flushing: Secondary | ICD-10-CM | POA: Diagnosis not present

## 2022-08-29 DIAGNOSIS — I10 Essential (primary) hypertension: Secondary | ICD-10-CM | POA: Diagnosis not present

## 2022-08-29 DIAGNOSIS — G4700 Insomnia, unspecified: Secondary | ICD-10-CM | POA: Diagnosis not present

## 2022-09-04 DIAGNOSIS — Z0000 Encounter for general adult medical examination without abnormal findings: Secondary | ICD-10-CM | POA: Diagnosis not present

## 2022-09-04 DIAGNOSIS — Z13220 Encounter for screening for lipoid disorders: Secondary | ICD-10-CM | POA: Diagnosis not present

## 2022-09-04 DIAGNOSIS — Z114 Encounter for screening for human immunodeficiency virus [HIV]: Secondary | ICD-10-CM | POA: Diagnosis not present

## 2022-09-10 DIAGNOSIS — E785 Hyperlipidemia, unspecified: Secondary | ICD-10-CM | POA: Diagnosis not present

## 2022-09-10 DIAGNOSIS — Z23 Encounter for immunization: Secondary | ICD-10-CM | POA: Diagnosis not present

## 2022-09-10 DIAGNOSIS — Z1151 Encounter for screening for human papillomavirus (HPV): Secondary | ICD-10-CM | POA: Diagnosis not present

## 2022-09-10 DIAGNOSIS — I10 Essential (primary) hypertension: Secondary | ICD-10-CM | POA: Diagnosis not present

## 2022-09-10 DIAGNOSIS — Z113 Encounter for screening for infections with a predominantly sexual mode of transmission: Secondary | ICD-10-CM | POA: Diagnosis not present

## 2022-09-10 DIAGNOSIS — Z1211 Encounter for screening for malignant neoplasm of colon: Secondary | ICD-10-CM | POA: Diagnosis not present

## 2022-09-10 DIAGNOSIS — Z01419 Encounter for gynecological examination (general) (routine) without abnormal findings: Secondary | ICD-10-CM | POA: Diagnosis not present

## 2022-09-10 DIAGNOSIS — Z0000 Encounter for general adult medical examination without abnormal findings: Secondary | ICD-10-CM | POA: Diagnosis not present

## 2022-11-15 DIAGNOSIS — I10 Essential (primary) hypertension: Secondary | ICD-10-CM | POA: Diagnosis not present

## 2022-11-15 DIAGNOSIS — G4700 Insomnia, unspecified: Secondary | ICD-10-CM | POA: Diagnosis not present

## 2022-11-15 DIAGNOSIS — N951 Menopausal and female climacteric states: Secondary | ICD-10-CM | POA: Diagnosis not present

## 2022-11-15 DIAGNOSIS — F411 Generalized anxiety disorder: Secondary | ICD-10-CM | POA: Diagnosis not present

## 2022-11-18 DIAGNOSIS — Z1231 Encounter for screening mammogram for malignant neoplasm of breast: Secondary | ICD-10-CM | POA: Diagnosis not present

## 2022-12-11 DIAGNOSIS — E559 Vitamin D deficiency, unspecified: Secondary | ICD-10-CM | POA: Diagnosis not present

## 2022-12-11 DIAGNOSIS — I10 Essential (primary) hypertension: Secondary | ICD-10-CM | POA: Diagnosis not present

## 2022-12-11 DIAGNOSIS — E782 Mixed hyperlipidemia: Secondary | ICD-10-CM | POA: Diagnosis not present

## 2022-12-16 DIAGNOSIS — I10 Essential (primary) hypertension: Secondary | ICD-10-CM | POA: Diagnosis not present

## 2022-12-16 DIAGNOSIS — E559 Vitamin D deficiency, unspecified: Secondary | ICD-10-CM | POA: Diagnosis not present

## 2022-12-16 DIAGNOSIS — E782 Mixed hyperlipidemia: Secondary | ICD-10-CM | POA: Diagnosis not present

## 2022-12-16 DIAGNOSIS — Z79890 Hormone replacement therapy (postmenopausal): Secondary | ICD-10-CM | POA: Diagnosis not present

## 2023-01-09 ENCOUNTER — Encounter: Payer: Self-pay | Admitting: Radiology

## 2023-04-14 DIAGNOSIS — I10 Essential (primary) hypertension: Secondary | ICD-10-CM | POA: Diagnosis not present

## 2023-04-14 DIAGNOSIS — Z79890 Hormone replacement therapy (postmenopausal): Secondary | ICD-10-CM | POA: Diagnosis not present

## 2023-04-14 DIAGNOSIS — G4700 Insomnia, unspecified: Secondary | ICD-10-CM | POA: Diagnosis not present

## 2023-04-14 DIAGNOSIS — R232 Flushing: Secondary | ICD-10-CM | POA: Diagnosis not present

## 2023-05-09 DIAGNOSIS — D220 Melanocytic nevi of lip: Secondary | ICD-10-CM | POA: Diagnosis not present

## 2023-05-09 DIAGNOSIS — L821 Other seborrheic keratosis: Secondary | ICD-10-CM | POA: Diagnosis not present

## 2023-05-09 DIAGNOSIS — L814 Other melanin hyperpigmentation: Secondary | ICD-10-CM | POA: Diagnosis not present

## 2023-05-09 DIAGNOSIS — D225 Melanocytic nevi of trunk: Secondary | ICD-10-CM | POA: Diagnosis not present

## 2023-05-09 DIAGNOSIS — L570 Actinic keratosis: Secondary | ICD-10-CM | POA: Diagnosis not present

## 2023-05-09 DIAGNOSIS — D485 Neoplasm of uncertain behavior of skin: Secondary | ICD-10-CM | POA: Diagnosis not present

## 2023-05-09 DIAGNOSIS — L820 Inflamed seborrheic keratosis: Secondary | ICD-10-CM | POA: Diagnosis not present

## 2023-05-21 DIAGNOSIS — N952 Postmenopausal atrophic vaginitis: Secondary | ICD-10-CM | POA: Diagnosis not present

## 2023-05-21 DIAGNOSIS — I10 Essential (primary) hypertension: Secondary | ICD-10-CM | POA: Diagnosis not present

## 2023-05-21 DIAGNOSIS — N3000 Acute cystitis without hematuria: Secondary | ICD-10-CM | POA: Diagnosis not present

## 2023-06-30 DIAGNOSIS — R6882 Decreased libido: Secondary | ICD-10-CM | POA: Diagnosis not present

## 2023-06-30 DIAGNOSIS — Z6822 Body mass index (BMI) 22.0-22.9, adult: Secondary | ICD-10-CM | POA: Diagnosis not present

## 2023-06-30 DIAGNOSIS — Z1272 Encounter for screening for malignant neoplasm of vagina: Secondary | ICD-10-CM | POA: Diagnosis not present

## 2023-06-30 DIAGNOSIS — Z01419 Encounter for gynecological examination (general) (routine) without abnormal findings: Secondary | ICD-10-CM | POA: Diagnosis not present

## 2023-07-16 DIAGNOSIS — R300 Dysuria: Secondary | ICD-10-CM | POA: Diagnosis not present

## 2023-07-18 DIAGNOSIS — H04123 Dry eye syndrome of bilateral lacrimal glands: Secondary | ICD-10-CM | POA: Diagnosis not present

## 2023-08-11 DIAGNOSIS — K080 Exfoliation of teeth due to systemic causes: Secondary | ICD-10-CM | POA: Diagnosis not present

## 2023-08-22 DIAGNOSIS — N3000 Acute cystitis without hematuria: Secondary | ICD-10-CM | POA: Diagnosis not present

## 2023-09-02 DIAGNOSIS — N3000 Acute cystitis without hematuria: Secondary | ICD-10-CM | POA: Diagnosis not present

## 2023-09-02 DIAGNOSIS — K080 Exfoliation of teeth due to systemic causes: Secondary | ICD-10-CM | POA: Diagnosis not present

## 2023-09-05 DIAGNOSIS — N390 Urinary tract infection, site not specified: Secondary | ICD-10-CM | POA: Diagnosis not present

## 2023-09-09 DIAGNOSIS — M25561 Pain in right knee: Secondary | ICD-10-CM | POA: Diagnosis not present

## 2023-09-09 DIAGNOSIS — M1711 Unilateral primary osteoarthritis, right knee: Secondary | ICD-10-CM | POA: Diagnosis not present

## 2023-09-11 DIAGNOSIS — R300 Dysuria: Secondary | ICD-10-CM | POA: Diagnosis not present

## 2023-09-11 DIAGNOSIS — Z113 Encounter for screening for infections with a predominantly sexual mode of transmission: Secondary | ICD-10-CM | POA: Diagnosis not present

## 2023-09-11 DIAGNOSIS — N760 Acute vaginitis: Secondary | ICD-10-CM | POA: Diagnosis not present

## 2023-09-15 DIAGNOSIS — I10 Essential (primary) hypertension: Secondary | ICD-10-CM | POA: Diagnosis not present

## 2023-09-15 DIAGNOSIS — Z0000 Encounter for general adult medical examination without abnormal findings: Secondary | ICD-10-CM | POA: Diagnosis not present

## 2023-09-15 DIAGNOSIS — Z7185 Encounter for immunization safety counseling: Secondary | ICD-10-CM | POA: Diagnosis not present

## 2023-09-15 DIAGNOSIS — M542 Cervicalgia: Secondary | ICD-10-CM | POA: Diagnosis not present

## 2023-10-06 DIAGNOSIS — R3121 Asymptomatic microscopic hematuria: Secondary | ICD-10-CM | POA: Diagnosis not present

## 2023-10-06 DIAGNOSIS — N3020 Other chronic cystitis without hematuria: Secondary | ICD-10-CM | POA: Diagnosis not present

## 2023-10-06 DIAGNOSIS — N952 Postmenopausal atrophic vaginitis: Secondary | ICD-10-CM | POA: Diagnosis not present

## 2023-10-27 DIAGNOSIS — R319 Hematuria, unspecified: Secondary | ICD-10-CM | POA: Diagnosis not present

## 2023-10-27 DIAGNOSIS — M62838 Other muscle spasm: Secondary | ICD-10-CM | POA: Diagnosis not present

## 2023-10-27 DIAGNOSIS — I10 Essential (primary) hypertension: Secondary | ICD-10-CM | POA: Diagnosis not present

## 2023-11-03 DIAGNOSIS — K5730 Diverticulosis of large intestine without perforation or abscess without bleeding: Secondary | ICD-10-CM | POA: Diagnosis not present

## 2023-11-03 DIAGNOSIS — R3121 Asymptomatic microscopic hematuria: Secondary | ICD-10-CM | POA: Diagnosis not present

## 2023-11-03 DIAGNOSIS — N200 Calculus of kidney: Secondary | ICD-10-CM | POA: Diagnosis not present

## 2023-11-03 DIAGNOSIS — R109 Unspecified abdominal pain: Secondary | ICD-10-CM | POA: Diagnosis not present

## 2023-11-19 DIAGNOSIS — H18832 Recurrent erosion of cornea, left eye: Secondary | ICD-10-CM | POA: Diagnosis not present

## 2023-11-19 DIAGNOSIS — H5203 Hypermetropia, bilateral: Secondary | ICD-10-CM | POA: Diagnosis not present

## 2023-11-19 DIAGNOSIS — H04123 Dry eye syndrome of bilateral lacrimal glands: Secondary | ICD-10-CM | POA: Diagnosis not present

## 2023-11-28 DIAGNOSIS — R3121 Asymptomatic microscopic hematuria: Secondary | ICD-10-CM | POA: Diagnosis not present

## 2023-11-28 DIAGNOSIS — N952 Postmenopausal atrophic vaginitis: Secondary | ICD-10-CM | POA: Diagnosis not present

## 2023-11-28 DIAGNOSIS — N3020 Other chronic cystitis without hematuria: Secondary | ICD-10-CM | POA: Diagnosis not present

## 2023-12-08 DIAGNOSIS — R0781 Pleurodynia: Secondary | ICD-10-CM | POA: Diagnosis not present

## 2023-12-08 DIAGNOSIS — R1011 Right upper quadrant pain: Secondary | ICD-10-CM | POA: Diagnosis not present

## 2023-12-08 DIAGNOSIS — N200 Calculus of kidney: Secondary | ICD-10-CM | POA: Diagnosis not present

## 2023-12-10 DIAGNOSIS — R300 Dysuria: Secondary | ICD-10-CM | POA: Diagnosis not present

## 2023-12-10 DIAGNOSIS — R42 Dizziness and giddiness: Secondary | ICD-10-CM | POA: Diagnosis not present

## 2023-12-10 DIAGNOSIS — M546 Pain in thoracic spine: Secondary | ICD-10-CM | POA: Diagnosis not present

## 2023-12-11 DIAGNOSIS — I10 Essential (primary) hypertension: Secondary | ICD-10-CM | POA: Diagnosis not present

## 2023-12-11 DIAGNOSIS — R109 Unspecified abdominal pain: Secondary | ICD-10-CM | POA: Diagnosis not present

## 2023-12-11 DIAGNOSIS — R42 Dizziness and giddiness: Secondary | ICD-10-CM | POA: Diagnosis not present

## 2023-12-15 DIAGNOSIS — I10 Essential (primary) hypertension: Secondary | ICD-10-CM | POA: Diagnosis not present

## 2023-12-15 DIAGNOSIS — N390 Urinary tract infection, site not specified: Secondary | ICD-10-CM | POA: Diagnosis not present

## 2023-12-15 DIAGNOSIS — R42 Dizziness and giddiness: Secondary | ICD-10-CM | POA: Diagnosis not present

## 2023-12-15 DIAGNOSIS — G4700 Insomnia, unspecified: Secondary | ICD-10-CM | POA: Diagnosis not present

## 2023-12-30 DIAGNOSIS — M545 Low back pain, unspecified: Secondary | ICD-10-CM | POA: Diagnosis not present

## 2023-12-30 DIAGNOSIS — R1030 Lower abdominal pain, unspecified: Secondary | ICD-10-CM | POA: Diagnosis not present

## 2023-12-30 DIAGNOSIS — R350 Frequency of micturition: Secondary | ICD-10-CM | POA: Diagnosis not present

## 2023-12-30 DIAGNOSIS — M62838 Other muscle spasm: Secondary | ICD-10-CM | POA: Diagnosis not present

## 2024-01-27 DIAGNOSIS — I10 Essential (primary) hypertension: Secondary | ICD-10-CM | POA: Diagnosis not present

## 2024-01-27 DIAGNOSIS — N329 Bladder disorder, unspecified: Secondary | ICD-10-CM | POA: Diagnosis not present

## 2024-01-27 DIAGNOSIS — R251 Tremor, unspecified: Secondary | ICD-10-CM | POA: Diagnosis not present

## 2024-01-27 DIAGNOSIS — R42 Dizziness and giddiness: Secondary | ICD-10-CM | POA: Diagnosis not present

## 2024-02-04 DIAGNOSIS — N390 Urinary tract infection, site not specified: Secondary | ICD-10-CM | POA: Diagnosis not present

## 2024-02-04 DIAGNOSIS — R3129 Other microscopic hematuria: Secondary | ICD-10-CM | POA: Diagnosis not present

## 2024-02-11 DIAGNOSIS — N329 Bladder disorder, unspecified: Secondary | ICD-10-CM | POA: Diagnosis not present

## 2024-02-11 DIAGNOSIS — R3129 Other microscopic hematuria: Secondary | ICD-10-CM | POA: Diagnosis not present

## 2024-02-11 DIAGNOSIS — N390 Urinary tract infection, site not specified: Secondary | ICD-10-CM | POA: Diagnosis not present

## 2024-02-11 DIAGNOSIS — R339 Retention of urine, unspecified: Secondary | ICD-10-CM | POA: Diagnosis not present

## 2024-02-16 DIAGNOSIS — K080 Exfoliation of teeth due to systemic causes: Secondary | ICD-10-CM | POA: Diagnosis not present

## 2024-02-25 DIAGNOSIS — Z01810 Encounter for preprocedural cardiovascular examination: Secondary | ICD-10-CM | POA: Diagnosis not present

## 2024-02-25 DIAGNOSIS — Z01818 Encounter for other preprocedural examination: Secondary | ICD-10-CM | POA: Diagnosis not present

## 2024-02-25 DIAGNOSIS — I10 Essential (primary) hypertension: Secondary | ICD-10-CM | POA: Diagnosis not present

## 2024-02-25 DIAGNOSIS — E871 Hypo-osmolality and hyponatremia: Secondary | ICD-10-CM | POA: Diagnosis not present

## 2024-02-27 DIAGNOSIS — Z79899 Other long term (current) drug therapy: Secondary | ICD-10-CM | POA: Diagnosis not present

## 2024-02-27 DIAGNOSIS — N3020 Other chronic cystitis without hematuria: Secondary | ICD-10-CM | POA: Diagnosis not present

## 2024-02-27 DIAGNOSIS — D494 Neoplasm of unspecified behavior of bladder: Secondary | ICD-10-CM | POA: Diagnosis not present

## 2024-02-27 DIAGNOSIS — K219 Gastro-esophageal reflux disease without esophagitis: Secondary | ICD-10-CM | POA: Diagnosis not present

## 2024-02-27 DIAGNOSIS — L928 Other granulomatous disorders of the skin and subcutaneous tissue: Secondary | ICD-10-CM | POA: Diagnosis not present

## 2024-02-27 DIAGNOSIS — I10 Essential (primary) hypertension: Secondary | ICD-10-CM | POA: Diagnosis not present

## 2024-02-27 DIAGNOSIS — N3000 Acute cystitis without hematuria: Secondary | ICD-10-CM | POA: Diagnosis not present

## 2024-03-08 DIAGNOSIS — N390 Urinary tract infection, site not specified: Secondary | ICD-10-CM | POA: Diagnosis not present

## 2024-03-08 DIAGNOSIS — Z87898 Personal history of other specified conditions: Secondary | ICD-10-CM | POA: Diagnosis not present

## 2024-03-08 DIAGNOSIS — N3289 Other specified disorders of bladder: Secondary | ICD-10-CM | POA: Diagnosis not present

## 2024-03-08 DIAGNOSIS — R3129 Other microscopic hematuria: Secondary | ICD-10-CM | POA: Diagnosis not present

## 2024-04-12 DIAGNOSIS — R152 Fecal urgency: Secondary | ICD-10-CM | POA: Diagnosis not present

## 2024-04-13 DIAGNOSIS — M1711 Unilateral primary osteoarthritis, right knee: Secondary | ICD-10-CM | POA: Diagnosis not present

## 2024-04-27 DIAGNOSIS — K1379 Other lesions of oral mucosa: Secondary | ICD-10-CM | POA: Diagnosis not present

## 2024-04-27 DIAGNOSIS — K120 Recurrent oral aphthae: Secondary | ICD-10-CM | POA: Diagnosis not present

## 2024-05-10 DIAGNOSIS — F419 Anxiety disorder, unspecified: Secondary | ICD-10-CM | POA: Diagnosis not present

## 2024-05-10 DIAGNOSIS — H9192 Unspecified hearing loss, left ear: Secondary | ICD-10-CM | POA: Diagnosis not present

## 2024-05-30 ENCOUNTER — Other Ambulatory Visit: Payer: Self-pay

## 2024-05-30 ENCOUNTER — Emergency Department (HOSPITAL_COMMUNITY)

## 2024-05-30 ENCOUNTER — Emergency Department (HOSPITAL_COMMUNITY)
Admission: EM | Admit: 2024-05-30 | Discharge: 2024-05-30 | Disposition: A | Discharge status: Home / Self Care | Attending: Emergency Medicine | Admitting: Emergency Medicine

## 2024-05-30 ENCOUNTER — Encounter (HOSPITAL_COMMUNITY): Payer: Self-pay

## 2024-05-30 DIAGNOSIS — S8991XA Unspecified injury of right lower leg, initial encounter: Secondary | ICD-10-CM | POA: Diagnosis not present

## 2024-05-30 DIAGNOSIS — R000 Tachycardia, unspecified: Secondary | ICD-10-CM | POA: Diagnosis not present

## 2024-05-30 DIAGNOSIS — S81811A Laceration without foreign body, right lower leg, initial encounter: Secondary | ICD-10-CM | POA: Diagnosis not present

## 2024-05-30 DIAGNOSIS — Z23 Encounter for immunization: Secondary | ICD-10-CM | POA: Insufficient documentation

## 2024-05-30 DIAGNOSIS — Y92410 Unspecified street and highway as the place of occurrence of the external cause: Secondary | ICD-10-CM | POA: Insufficient documentation

## 2024-05-30 DIAGNOSIS — S0003XA Contusion of scalp, initial encounter: Secondary | ICD-10-CM | POA: Diagnosis not present

## 2024-05-30 DIAGNOSIS — R4781 Slurred speech: Secondary | ICD-10-CM | POA: Diagnosis not present

## 2024-05-30 DIAGNOSIS — M7989 Other specified soft tissue disorders: Secondary | ICD-10-CM | POA: Diagnosis not present

## 2024-05-30 DIAGNOSIS — S199XXA Unspecified injury of neck, initial encounter: Secondary | ICD-10-CM | POA: Diagnosis not present

## 2024-05-30 DIAGNOSIS — S80811A Abrasion, right lower leg, initial encounter: Secondary | ICD-10-CM

## 2024-05-30 DIAGNOSIS — S0990XA Unspecified injury of head, initial encounter: Secondary | ICD-10-CM | POA: Diagnosis not present

## 2024-05-30 MED ORDER — TETANUS-DIPHTH-ACELL PERTUSSIS 5-2.5-18.5 LF-MCG/0.5 IM SUSY
0.5000 mL | PREFILLED_SYRINGE | Freq: Once | INTRAMUSCULAR | Status: AC
  Administered 2024-05-30: 0.5 mL via INTRAMUSCULAR
  Filled 2024-05-30: qty 0.5

## 2024-05-30 NOTE — ED Notes (Signed)
 Reviewed D/C information with the patient, pt verbalized understanding. No additional concerns at this time.

## 2024-05-30 NOTE — ED Notes (Addendum)
 Lab & [employer] present at the bedside for forensic blood draw.
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
## 2024-05-31 DIAGNOSIS — R251 Tremor, unspecified: Secondary | ICD-10-CM | POA: Diagnosis not present

## 2024-05-31 DIAGNOSIS — M545 Low back pain, unspecified: Secondary | ICD-10-CM | POA: Diagnosis not present

## 2024-06-03 ENCOUNTER — Other Ambulatory Visit: Payer: Self-pay | Admitting: Nurse Practitioner

## 2024-06-03 DIAGNOSIS — R251 Tremor, unspecified: Secondary | ICD-10-CM

## 2024-06-03 DIAGNOSIS — M545 Low back pain, unspecified: Secondary | ICD-10-CM

## 2024-06-18 ENCOUNTER — Other Ambulatory Visit
